# Patient Record
Sex: Female | Born: 1995 | Race: White | Hispanic: No | Marital: Single | Smoking: Former smoker
Health system: Southern US, Community
[De-identification: ages and names within clinical notes are randomized; demographics above are authoritative.]

## PROBLEM LIST (undated history)

## (undated) DIAGNOSIS — M419 Scoliosis, unspecified: Secondary | ICD-10-CM

## (undated) DIAGNOSIS — N200 Calculus of kidney: Secondary | ICD-10-CM

## (undated) DIAGNOSIS — F32A Depression, unspecified: Secondary | ICD-10-CM

## (undated) DIAGNOSIS — N390 Urinary tract infection, site not specified: Secondary | ICD-10-CM

## (undated) DIAGNOSIS — E282 Polycystic ovarian syndrome: Secondary | ICD-10-CM

## (undated) DIAGNOSIS — Z87442 Personal history of urinary calculi: Secondary | ICD-10-CM

## (undated) DIAGNOSIS — F319 Bipolar disorder, unspecified: Secondary | ICD-10-CM

## (undated) HISTORY — PX: GASTRIC BYPASS: SHX52

## (undated) HISTORY — PX: KIDNEY STONE SURGERY: SHX686

## (undated) HISTORY — PX: BREAST ENHANCEMENT SURGERY: SHX7

## (undated) HISTORY — PX: TONSILLECTOMY: SUR1361

---
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
## 2013-04-07 ENCOUNTER — Ambulatory Visit: Payer: BC Managed Care – PPO | Admitting: Dietician

## 2014-08-29 DIAGNOSIS — F909 Attention-deficit hyperactivity disorder, unspecified type: Secondary | ICD-10-CM | POA: Insufficient documentation

## 2014-09-05 ENCOUNTER — Other Ambulatory Visit: Payer: Self-pay | Admitting: Gastroenterology

## 2014-09-05 DIAGNOSIS — R112 Nausea with vomiting, unspecified: Secondary | ICD-10-CM

## 2014-09-26 ENCOUNTER — Ambulatory Visit (HOSPITAL_COMMUNITY): Payer: Self-pay

## 2016-03-25 ENCOUNTER — Emergency Department (HOSPITAL_COMMUNITY)
Admission: EM | Admit: 2016-03-25 | Discharge: 2016-03-25 | Disposition: A | Discharge status: Home / Self Care | Payer: BLUE CROSS/BLUE SHIELD | Attending: Emergency Medicine | Admitting: Emergency Medicine

## 2016-03-25 ENCOUNTER — Emergency Department (HOSPITAL_COMMUNITY): Payer: BLUE CROSS/BLUE SHIELD

## 2016-03-25 ENCOUNTER — Encounter (HOSPITAL_COMMUNITY): Payer: Self-pay | Admitting: Emergency Medicine

## 2016-03-25 DIAGNOSIS — N201 Calculus of ureter: Secondary | ICD-10-CM | POA: Insufficient documentation

## 2016-03-25 DIAGNOSIS — R1032 Left lower quadrant pain: Secondary | ICD-10-CM | POA: Diagnosis present

## 2016-03-25 HISTORY — DX: Calculus of kidney: N20.0

## 2016-03-25 LAB — URINALYSIS, ROUTINE W REFLEX MICROSCOPIC
GLUCOSE, UA: NEGATIVE mg/dL
KETONES UR: 40 mg/dL — AB
NITRITE: NEGATIVE
PH: 6.5 (ref 5.0–8.0)
Protein, ur: NEGATIVE mg/dL
SPECIFIC GRAVITY, URINE: 1.014 (ref 1.005–1.030)

## 2016-03-25 LAB — I-STAT CHEM 8, ED
BUN: 9 mg/dL (ref 6–20)
CHLORIDE: 105 mmol/L (ref 101–111)
CREATININE: 1 mg/dL (ref 0.44–1.00)
Calcium, Ion: 1.21 mmol/L (ref 1.15–1.40)
GLUCOSE: 96 mg/dL (ref 65–99)
HEMATOCRIT: 48 % — AB (ref 36.0–46.0)
HEMOGLOBIN: 16.3 g/dL — AB (ref 12.0–15.0)
POTASSIUM: 3.4 mmol/L — AB (ref 3.5–5.1)
Sodium: 142 mmol/L (ref 135–145)
TCO2: 22 mmol/L (ref 0–100)

## 2016-03-25 LAB — URINE MICROSCOPIC-ADD ON

## 2016-03-25 LAB — I-STAT BETA HCG BLOOD, ED (MC, WL, AP ONLY): I-stat hCG, quantitative: <5 m[IU]/mL (ref <5)

## 2016-03-25 MED ORDER — TAMSULOSIN HCL 0.4 MG PO CAPS: 0.4000 mg | ORAL_CAPSULE | Freq: Every day | ORAL | 0 refills | Status: DC

## 2016-03-25 MED ORDER — ONDANSETRON 8 MG PO TBDP: 8.0000 mg | ORAL_TABLET | Freq: Three times a day (TID) | ORAL | 0 refills | Status: DC | PRN

## 2016-03-25 MED ORDER — MORPHINE SULFATE (PF) 4 MG/ML IV SOLN
4.0000 mg | Freq: Once | INTRAVENOUS | Status: AC
  Administered 2016-03-25: 4 mg via INTRAVENOUS
  Filled 2016-03-25: qty 1

## 2016-03-25 MED ORDER — ONDANSETRON HCL 4 MG/2ML IJ SOLN
4.0000 mg | Freq: Four times a day (QID) | INTRAMUSCULAR | Status: DC | PRN
Start: 2016-03-25 — End: 2016-03-25
  Administered 2016-03-25: 4 mg via INTRAVENOUS
  Filled 2016-03-25: qty 2

## 2016-03-25 MED ORDER — IBUPROFEN 400 MG PO TABS: 400.0000 mg | ORAL_TABLET | Freq: Three times a day (TID) | ORAL | 0 refills | Status: DC | PRN

## 2016-03-25 MED ORDER — OXYCODONE-ACETAMINOPHEN 5-325 MG PO TABS: 1.0000 | ORAL_TABLET | ORAL | 0 refills | Status: DC | PRN

## 2016-03-25 MED ORDER — MORPHINE SULFATE (PF) 4 MG/ML IV SOLN
4.0000 mg | INTRAVENOUS | Status: DC | PRN
Start: 2016-03-25 — End: 2016-03-25
  Administered 2016-03-25 (×2): 4 mg via INTRAVENOUS
  Filled 2016-03-25 (×2): qty 1

## 2016-03-25 MED ORDER — KETOROLAC TROMETHAMINE 30 MG/ML IJ SOLN
30.0000 mg | Freq: Once | INTRAMUSCULAR | Status: AC
  Administered 2016-03-25: 30 mg via INTRAVENOUS
  Filled 2016-03-25: qty 1

## 2016-03-25 NOTE — ED Triage Notes (Signed)
Pt sts left sided flank pain similar to kidney stones in past; pt appears in severe pain at present

## 2016-03-25 NOTE — ED Notes (Signed)
Collected patient urine pt is resting

## 2016-03-26 NOTE — ED Provider Notes (Signed)
[facility]-EMERGENCY DEPT Provider Note   CSN: [id number] Arrival date & time: 03/25/16  9528     History   Chief Complaint Chief Complaint  Patient presents with  . Flank Pain    HPI Colleen Morrison is a 20 y.o. female.  Patient presents with acute left sided flank and left lower abdominal pain which worsened today.  She denies dysuria urinary frequency.  She reports nausea without vomiting.  Denies diarrhea.  No fevers or chills.  She states severe pain in her left low pelvic region at this time.  No vaginal complaints.  No urinary complaints.  Pain is severe in severity.  The patient is writhing in the bed currently.   The history is provided by the patient.    Past Medical History:  Diagnosis Date  . Kidney stones   . Scoliosis   . Urinary tract infection     There are no active problems to display for this patient.   Past Surgical History:  Procedure Laterality Date  . GASTRIC BYPASS    . TONSILLECTOMY      OB History    No data available       Home Medications    Prior to Admission medications   Medication Sig Start Date End Date Taking? Authorizing Provider  cholecalciferol (VITAMIN D) 1000 units tablet Take 1,000 Units by mouth daily.   Yes Historical Provider, MD  Multiple Vitamin (MULTIVITAMIN WITH MINERALS) TABS tablet Take 1 tablet by mouth daily.   Yes Historical Provider, MD  ibuprofen (ADVIL,MOTRIN) 400 MG tablet Take 1 tablet (400 mg total) by mouth every 8 (eight) hours as needed. 03/25/16   Azalia Bilis, MD  ondansetron (ZOFRAN ODT) 8 MG disintegrating tablet Take 1 tablet (8 mg total) by mouth every 8 (eight) hours as needed for nausea or vomiting. 03/25/16   Azalia Bilis, MD  oxyCODONE-acetaminophen (PERCOCET/ROXICET) 5-325 MG tablet Take 1 tablet by mouth every 4 (four) hours as needed for severe pain. 03/25/16   Azalia Bilis, MD  tamsulosin (FLOMAX) 0.4 MG CAPS capsule Take 1 capsule (0.4 mg total) by mouth daily. 03/25/16   Azalia Bilis, MD     Family History Family History  Problem Relation Age of Onset  . Hyperlipidemia Mother   . Diabetes Other   . Hyperlipidemia Other   . Stroke Other   . Heart failure Other     Social History Social History  Substance Use Topics  . Smoking status: Never Smoker  . Smokeless tobacco: Never Used  . Alcohol use Yes     Comment: occ     Allergies   Other   Review of Systems Review of Systems  All other systems reviewed and are negative.    Physical Exam Updated Vital Signs BP (!) 92/53 (BP Location: Right Arm)   Pulse 60   Temp 98.1 F (36.7 C) (Oral)   Resp 20   Ht 5\' 2"  (1.575 m)   Wt 115 lb (52.2 kg)   SpO2 99%   BMI 21.03 kg/m   Physical Exam  Constitutional: She is oriented to person, place, and time. She appears well-developed and well-nourished.  Uncomfortable appearing  HENT:  Head: Normocephalic and atraumatic.  Eyes: EOM are normal.  Neck: Normal range of motion.  Cardiovascular: Normal rate, regular rhythm and normal heart sounds.   Pulmonary/Chest: Effort normal and breath sounds normal.  Abdominal: Soft. She exhibits no distension. There is no tenderness.  Musculoskeletal: Normal range of motion.  Neurological: She is alert  and oriented to person, place, and time.  Skin: Skin is warm and dry.  Psychiatric: She has a normal mood and affect. Judgment normal.  Nursing note and vitals reviewed.    ED Treatments / Results  Labs (all labs ordered are listed, but only abnormal results are displayed) Labs Reviewed  URINALYSIS, ROUTINE W REFLEX MICROSCOPIC (NOT AT [facility]) - Abnormal; Notable for the following:       Result Value   Color, Urine AMBER (*)    APPearance CLOUDY (*)    Hgb urine dipstick LARGE (*)    Bilirubin Urine SMALL (*)    Ketones, ur 40 (*)    Leukocytes, UA TRACE (*)    All other components within normal limits  URINE MICROSCOPIC-ADD ON - Abnormal; Notable for the following:    Squamous Epithelial / LPF 0-5 (*)     Bacteria, UA FEW (*)    All other components within normal limits  I-STAT CHEM 8, ED - Abnormal; Notable for the following:    Potassium 3.4 (*)    Hemoglobin 16.3 (*)    HCT 48.0 (*)    All other components within normal limits  I-STAT BETA HCG BLOOD, ED ([facility], [facility], [facility] ONLY)    EKG  EKG Interpretation None       Radiology Ct Renal Stone Study  Result Date: 03/25/2016 CLINICAL DATA:  Left flank pain.  Prior gastric bypass. EXAM: CT ABDOMEN AND PELVIS WITHOUT CONTRAST TECHNIQUE: Multidetector CT imaging of the abdomen and pelvis was performed following the standard protocol without IV contrast. COMPARISON:  10/21/2012 FINDINGS: Lower chest: Lung bases are clear. No effusions. Heart is normal size. Hepatobiliary: No focal hepatic abnormality. Gallbladder unremarkable. Pancreas: No focal abnormality or ductal dilatation. Spleen: No focal abnormality.  Normal size. Adrenals/Urinary Tract: There is mild fullness of the left renal collecting system. Punctate calcification noted along the left posterior bladder wall concerning for a left UVJ stone. 2 mm nonobstructing stone in the lower pole of the left kidney. No stones or hydronephrosis on the right. Adrenal glands unremarkable. Stomach/Bowel: Appendix is normal. Changes of prior gastric bypass. Bowel grossly unremarkable. Vascular/Lymphatic: No evidence of aneurysm or adenopathy. Reproductive: Uterus and adnexa unremarkable.  No mass. Other: Small amount of free fluid in the pelvis, likely physiologic. No free air. Musculoskeletal: No acute bony abnormality or focal bone lesion. IMPRESSION: Suspect punctate 1 mm stone at the left UVJ. Mild fullness of the left renal collecting system. Left lower pole nephrolithiasis. Prior gastric bypass without visible complicating feature. Electronically Signed   By: Charlett NoseKevin  Dover M.D.   On: 03/25/2016 11:17    Procedures Procedures (including critical care time)  Medications Ordered in ED Medications   morphine 4 MG/ML injection 4 mg (4 mg Intravenous Given 03/25/16 1001)  ketorolac (TORADOL) 30 MG/ML injection 30 mg (30 mg Intravenous Given 03/25/16 1027)     Initial Impression / Assessment and Plan / ED Course  I have reviewed the triage vital signs and the nursing notes.  Pertinent labs & imaging results that were available during my care of the patient were reviewed by me and considered in my medical decision making (see chart for details).  Clinical Course     Patient's pain controlled in the emergency department.  1 mm distal left ureteral stone.  No signs urinary tract infection.  Home with standard stone precautions.  Urology follow-up.  Home with standard medications.  Final Clinical Impressions(s) / ED Diagnoses   Final diagnoses:  Left ureteral  stone    New Prescriptions Discharge Medication List as of 03/25/2016  2:20 PM    START taking these medications   Details  ibuprofen (ADVIL,MOTRIN) 400 MG tablet Take 1 tablet (400 mg total) by mouth every 8 (eight) hours as needed., Starting Mon 03/25/2016, Print    ondansetron (ZOFRAN ODT) 8 MG disintegrating tablet Take 1 tablet (8 mg total) by mouth every 8 (eight) hours as needed for nausea or vomiting., Starting Mon 03/25/2016, Print    oxyCODONE-acetaminophen (PERCOCET/ROXICET) 5-325 MG tablet Take 1 tablet by mouth every 4 (four) hours as needed for severe pain., Starting Mon 03/25/2016, Print    tamsulosin (FLOMAX) 0.4 MG CAPS capsule Take 1 capsule (0.4 mg total) by mouth daily., Starting Mon 03/25/2016, Print         Azalia BilisKevin Mckynleigh Mussell, MD 03/26/16 [phone]

## 2016-04-11 ENCOUNTER — Emergency Department (HOSPITAL_COMMUNITY)
Admission: EM | Admit: 2016-04-11 | Discharge: 2016-04-12 | Disposition: A | Discharge status: Home / Self Care | Payer: PRIVATE HEALTH INSURANCE | Attending: Emergency Medicine | Admitting: Emergency Medicine

## 2016-04-11 ENCOUNTER — Encounter (HOSPITAL_COMMUNITY): Payer: Self-pay | Admitting: *Deleted

## 2016-04-11 DIAGNOSIS — F332 Major depressive disorder, recurrent severe without psychotic features: Secondary | ICD-10-CM | POA: Diagnosis not present

## 2016-04-11 DIAGNOSIS — Z5181 Encounter for therapeutic drug level monitoring: Secondary | ICD-10-CM | POA: Insufficient documentation

## 2016-04-11 DIAGNOSIS — F17210 Nicotine dependence, cigarettes, uncomplicated: Secondary | ICD-10-CM | POA: Insufficient documentation

## 2016-04-11 DIAGNOSIS — Z823 Family history of stroke: Secondary | ICD-10-CM | POA: Diagnosis not present

## 2016-04-11 DIAGNOSIS — F329 Major depressive disorder, single episode, unspecified: Secondary | ICD-10-CM | POA: Insufficient documentation

## 2016-04-11 DIAGNOSIS — Z833 Family history of diabetes mellitus: Secondary | ICD-10-CM | POA: Diagnosis not present

## 2016-04-11 DIAGNOSIS — Z8349 Family history of other endocrine, nutritional and metabolic diseases: Secondary | ICD-10-CM | POA: Diagnosis not present

## 2016-04-11 DIAGNOSIS — F32A Depression, unspecified: Secondary | ICD-10-CM

## 2016-04-11 DIAGNOSIS — R45851 Suicidal ideations: Secondary | ICD-10-CM | POA: Diagnosis present

## 2016-04-11 HISTORY — DX: Polycystic ovarian syndrome: E28.2

## 2016-04-11 LAB — ACETAMINOPHEN LEVEL

## 2016-04-11 LAB — SALICYLATE LEVEL

## 2016-04-11 LAB — COMPREHENSIVE METABOLIC PANEL
ALK PHOS: 65 U/L (ref 38–126)
ALT: 16 U/L (ref 14–54)
ANION GAP: 9 (ref 5–15)
AST: 19 U/L (ref 15–41)
Albumin: 5 g/dL (ref 3.5–5.0)
BILIRUBIN TOTAL: 1 mg/dL (ref 0.3–1.2)
BUN: 15 mg/dL (ref 6–20)
CALCIUM: 10.1 mg/dL (ref 8.9–10.3)
CO2: 25 mmol/L (ref 22–32)
Chloride: 105 mmol/L (ref 101–111)
Creatinine, Ser: 0.85 mg/dL (ref 0.44–1.00)
Glucose, Bld: 84 mg/dL (ref 65–99)
POTASSIUM: 3.9 mmol/L (ref 3.5–5.1)
Sodium: 139 mmol/L (ref 135–145)
TOTAL PROTEIN: 8.1 g/dL (ref 6.5–8.1)

## 2016-04-11 LAB — RAPID URINE DRUG SCREEN, HOSP PERFORMED
Amphetamines: NOT DETECTED
BARBITURATES: NOT DETECTED
Benzodiazepines: NOT DETECTED
COCAINE: NOT DETECTED
OPIATES: NOT DETECTED
Tetrahydrocannabinol: POSITIVE — AB

## 2016-04-11 LAB — CBC
HEMATOCRIT: 43.5 % (ref 36.0–46.0)
Hemoglobin: 14.9 g/dL (ref 12.0–15.0)
MCH: 30 pg (ref 26.0–34.0)
MCHC: 34.3 g/dL (ref 30.0–36.0)
MCV: 87.7 fL (ref 78.0–100.0)
PLATELETS: 364 10*3/uL (ref 150–400)
RBC: 4.96 MIL/uL (ref 3.87–5.11)
RDW: 12.6 % (ref 11.5–15.5)
WBC: 10.1 10*3/uL (ref 4.0–10.5)

## 2016-04-11 LAB — POC URINE PREG, ED: Preg Test, Ur: NEGATIVE

## 2016-04-11 LAB — ETHANOL

## 2016-04-11 NOTE — ED Notes (Signed)
Pt on phone with mother requesting a ride home.  Pt requested RN speak with her mother.  Mother stating "we're not bringing her home in our car.  She's had suicidal thoughts all day and we don't feel comfortable letting her ride in our car.  She was told police would take her home."  Informed mother police do not transport pt's home, bus passes are available but the last bus stops @ 2300 and no taxi vouchers are provided.  Call transferred to Samul DadaLisa, Charge RN.

## 2016-04-11 NOTE — ED Notes (Signed)
Pt currently does not want to remove clothing & jewelry stating "what if I don't stay."

## 2016-04-11 NOTE — ED Notes (Signed)
Pt stated "I have the oxycodone for kidney stones.  My parents don't like me the way I am.  They don't like my hair color or my piercings.  They tell me if I loved them I would change but I'm happy with myself.  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

## 2016-04-11 NOTE — BH Assessment (Addendum)
Tele Assessment Note   Colleen Morrison is an 20 y.o. female, who [facility]. Pt reported she was on the phone with her brother, and she said, she didn't want to be around any more. Pt reported, her brother called their mother, because he thought she took pills. Pt's brother called the police, who called EMS, who [facility] the [facility]. Pt reported she did not take any pills. Pt reported passive SI earlier, but nothing current. Pt denies SI, HI, AVH and self-injurious behaviors. Pt reported, having a default suicide plan, taking her medication for her kidney stones (Oxycodone). Pt reported, her triggers are her parents because she feels they do not accept her for who she is. Pt reported her best is not good enough. Pt reported she recently moved from [place] and her transition has been "great." Clinician noted from a nurse pt's mother has concerns if the pt is discharged. Pt's mother reported, the pt text her at 2028, she has ruined her life and if she is IVC'd she felt like killing herself. Pt's mother reported, the pt has been sending her suicidal text messages. Pt's mother reported, the pt text, say your goodbyes and be done.  Per pt's mother, her father and brother, also report, not feeling safe if the pt is [facility].   Per Dr.  Edwin DadaLong's note: "Colleen Morrison is a 20 y.o. female with PMH of depression not on medication with no prior suicide attempts presents to the emergency department for evaluation of suicidal thoughts and worsening depression. He states that she is recently moved back to [place] from [place] and had a fight this evening with her parents. She notes that over the past several weeks she's had worsening depression and intermittent suicidal thoughts. This evening after the fight she had thoughts of not wanting to be alive anymore but denies making an active plan. She was on the phone with another individual to ended up calling police  because they were concerned about her verbalized suicidal thoughts. Patient states that her thoughts of not changed significantly and she is not making any plans to do so. She is looking forward to starting a new job and [place] to get her own place. She feels that these are positive steps in her life and feels motivated to complete them. She denies any alcohol or drug abuse. No other medical complaints at this time."  Pt denied experiencing verbal, physical and sexual abuse. Pt's UDS is positive for marijuana. Pt reported not having a psychiatrist or a Veterinary surgeoncounselor. Pt denied previous inpatient admissions.   Pt presented alert, in street clothes, with logical/coherent speech. Pt's mood and affect are sad. Pt's thought process is coherent/relevant. Pt's judgement is partial. Pt's insight and impulse control are fair. Pt is oriented x4 (date, year, city and state). Pt reported if discharged she could contract for safety. Pt reported, if inpatient is recommended she will not sign in voluntarily. Clinican discussed the three possible dispositions (discharge with resources, AM Psychiatric Evaluation and inpatient treatment). Clinican discussed IVC and how it can impact the pt.   Diagnosis: Major Depressive Disorder, Recurrent, Severe  Past Medical History:  Past Medical History:  Diagnosis Date  . Kidney stones   . PCOS (polycystic ovarian syndrome)   . Scoliosis   . Urinary tract infection     Past Surgical History:  Procedure Laterality Date  . GASTRIC BYPASS    . TONSILLECTOMY  Family History:  Family History  Problem Relation Age of Onset  . Hyperlipidemia Mother   . Diabetes Other   . Hyperlipidemia Other   . Stroke Other   . Heart failure Other     Social History:  reports that she has been smoking Cigarettes.  She has never used smokeless tobacco. She reports that she drinks alcohol. She reports that she uses drugs, including  Marijuana.  Additional Social History:  Alcohol / Drug Use Pain Medications: See MAR Prescriptions: See MAR Over the Counter: See MAR History of alcohol / drug use?: Yes Substance #1 Name of Substance 1: Marijuana  1 - Age of First Use: UTA 1 - Amount (size/oz): UTA 1 - Frequency: UTA 1 - Duration: UTA 1 - Last Use / Amount: UTA  CIWA: CIWA-Ar BP: 119/69 Pulse Rate: (!) 54 COWS:    PATIENT STRENGTHS: (choose at least two) Communication skills Supportive family/friends  Allergies:  Allergies  Allergen Reactions  . Other     mqysma -swelling    Home Medications:  (Not in a hospital admission)  OB/GYN Status:  Patient's last menstrual period was 03/14/2016 (approximate).  General Assessment Data [facility] of Assessment: [facility] TTS Assessment: In system Is this a Tele or Face-to-Face Assessment?: Face-to-Face Is this an Initial Assessment or a Re-assessment for this encounter?: Initial Assessment Marital status: Single Maiden name: NA Is patient pregnant?: No Pregnancy Status: No Living Arrangements: Other (Comment) (Pt reported with her best friend. ) Can pt return to current living arrangement?: Yes Admission Status: Voluntary Is patient capable of signing voluntary admission?: Yes Referral Source: Self/Family/Friend Insurance type: BCBS     Crisis Care Plan Living Arrangements: Other (Comment) (Pt reported with her best friend. ) Legal Guardian:  (Self) Name of Psychiatrist: NA Name of Therapist: NA  Education Status Is patient currently in school?: No Current Grade: NA Highest grade of school patient has completed: Pt reported four and a half years in college.  Name of school: NA Contact person: NA  Risk to self with the past 6 months Suicidal Ideation: No (Pt reported earlier passive SI.) Has patient been a risk to self within the past 6 months prior to admission? : No Suicidal Intent: No Has patient had any suicidal intent within the past 6  months prior to admission? : No Is patient at risk for suicide?: No Suicidal Plan?: No (Pt reported, default to take pills, but will not do it. ) Has patient had any suicidal plan within the past 6 months prior to admission? :  (Unknown) Access to Means:  (Pt has pills.) What has been your use of drugs/alcohol within the last 12 months?: marijuana Previous Attempts/Gestures: No How many times?: 0 Other Self Harm Risks: NA Triggers for Past Attempts: None known Intentional Self Injurious Behavior: None Family Suicide History: Unknown Recent stressful life event(s): Other (Comment) (Pt reported her family not accepting her for who she is. ) Persecutory voices/beliefs?: No Depression: Yes Depression Symptoms:  (sadness) Substance abuse history and/or treatment for substance abuse?: No Suicide prevention information given to non-admitted patients: Not applicable  Risk to Others within the past 6 months Homicidal Ideation: No Does patient have any lifetime risk of violence toward others beyond the six months prior to admission? : No Thoughts of Harm to Others: No Current Homicidal Intent: No Current Homicidal Plan: No Access to Homicidal Means: No Identified Victim: NA History of harm to others?: No Assessment of Violence: None Noted Violent Behavior Description: NA Does patient  have access to weapons?: No Criminal Charges Pending?: No Describe Pending Criminal Charges: NA Does patient have a court date: Yes Court Date: 06/11/16 Is patient on probation?: Yes (For possession of marijuana)  Psychosis Hallucinations: None noted Delusions: None noted  Mental Status Report Appearance/Hygiene: Unremarkable Eye Contact: Fair Motor Activity: Unremarkable Speech: Logical/coherent Level of Consciousness: Alert Mood: Sad Affect: Appropriate to circumstance Anxiety Level: Minimal Thought Processes: Coherent, Relevant Judgement: Unimpaired Orientation: Other (Comment) (date, year,  city and state.) Obsessive Compulsive Thoughts/Behaviors: Unable to Assess  Cognitive Functioning Concentration: Fair Memory: Recent Intact IQ: Average Insight: Good Impulse Control: Good Appetite: Fair Weight Loss:  (Pt had gastric bypass December 2016.) Weight Gain: 0 Sleep: Decreased Total Hours of Sleep: 6 Vegetative Symptoms: None  ADLScreening [facility] Assessment Services) Patient's cognitive ability adequate to safely complete daily activities?: Yes Patient able to express need for assistance with ADLs?: Yes Independently performs ADLs?: Yes (appropriate for developmental age)  Prior Inpatient Therapy Prior Inpatient Therapy: No Prior Therapy Dates: NA Prior Therapy Facilty/Provider(s): NA Reason for Treatment: NA  Prior Outpatient Therapy Prior Outpatient Therapy: No (In [place], nothing current. ) Prior Therapy Dates: NA Prior Therapy Facilty/Provider(s): NA Reason for Treatment: NA Does patient have an ACCT team?: No Does patient have Intensive In-House Services?  : No Does patient have [facility] services? : No Does patient have P4CC services?: No  ADL Screening (condition at time of admission) Patient's cognitive ability adequate to safely complete daily activities?: Yes Is the patient deaf or have difficulty hearing?: No Does the patient have difficulty seeing, even when wearing glasses/contacts?: Yes (Pt reports wearing glasses. ) Does the patient have difficulty concentrating, remembering, or making decisions?: Yes (Pt reported difficulty concentrating. ) Patient able to express need for assistance with ADLs?: Yes Does the patient have difficulty dressing or bathing?: No Independently performs ADLs?: Yes (appropriate for developmental age) Does the patient have difficulty walking or climbing stairs?: No Weakness of Legs: None Weakness of Arms/Hands: None       Abuse/Neglect Assessment (Assessment to be complete while patient is alone) Physical Abuse:  Denies (Pt denies. ) Verbal Abuse: Denies (Pt denies.) Sexual Abuse: Denies (Pt denies. )     Advance Directives (For Healthcare) Does Patient Have a Medical Advance Directive?: No    Additional Information 1:1 In Past 12 Months?: No CIRT Risk: No Elopement Risk: No Does patient have medical clearance?: Yes     Disposition: Dr. Jacqulyn BathLong recommends AM Psychiatric Evaluation. Disposition discussed with Kallie LocksAjsa, RN, Consuella LoseElaine, RN and pt's mother.  Disposition Initial Assessment Completed for this Encounter: Yes Disposition of Patient: Other dispositions (Discharged with OPT resoruces, sign No-Harm contract. ) Other disposition(s): Other (Comment) (discharged with OPT resoruces, sign "no-harm" contract)  Gwinda Passereylese D Bennett 04/11/2016 11:48 PM   Gwinda Passereylese D Bennett, MS, [facility], [facility] Triage Specialist [phone]

## 2016-04-11 NOTE — ED Notes (Signed)
Spoke with TTS, approximately 30 minutes before consult.   Pt informed.  Pt now requesting to leave.  Informed pt will speak to Dr. Jacqulyn BathLong.

## 2016-04-11 NOTE — ED Notes (Signed)
Pt stating "how much longer before the consult occurs?"  Informed will speak with TTS.

## 2016-04-11 NOTE — ED Provider Notes (Signed)
Emergency Department Provider Note   I have reviewed the triage vital signs and the nursing notes.   HISTORY  Chief Complaint Suicidal   HPI Colleen FudgeStephanie A Morrison is a 20 y.o. female with PMH of depression not on medication with no prior suicide attempts presents to the emergency department for evaluation of suicidal thoughts and worsening depression. He states that she is recently moved back to [place] and had a fight this evening with her parents. She notes that over the past several weeks she's had worsening depression and intermittent suicidal thoughts. This evening after the fight she had thoughts of not wanting to be alive anymore but denies making an active plan. She was on the phone with another individual to ended up calling police because they were concerned about her verbalized suicidal thoughts.   Patient states that her thoughts of not changed significantly and she is not making any plans to do so. She is looking forward to starting a new job and [place] to get her own place. She feels that these are positive steps in her life and feels motivated to complete them. She denies any alcohol or drug abuse. No other medical complaints at this time.   Past Medical History:  Diagnosis Date  . Kidney stones   . PCOS (polycystic ovarian syndrome)   . Scoliosis   . Urinary tract infection     There are no active problems to display for this patient.   Past Surgical History:  Procedure Laterality Date  . GASTRIC BYPASS    . TONSILLECTOMY      Current Outpatient Rx  . Order #: [id number] Class: Print  . Order #: [id number] Class: Print  . Order #: [id number] Class: Print  . Order #: [id number] Class: Print    Allergies Other  Family History  Problem Relation Age of Onset  . Hyperlipidemia Mother   . Diabetes Other   . Hyperlipidemia Other   . Stroke Other   . Heart failure Other     Social History Social History    Substance Use Topics  . Smoking status: Current Some Day Smoker    Types: Cigarettes  . Smokeless tobacco: Never Used  . Alcohol use Yes     Comment: occ    Review of Systems  Constitutional: No fever/chills Eyes: No visual changes. ENT: No sore throat. Cardiovascular: Denies chest pain. Respiratory: Denies shortness of breath. Gastrointestinal: No abdominal pain.  No nausea, no vomiting.  No diarrhea.  No constipation. Genitourinary: Negative for dysuria. Musculoskeletal: Negative for back pain. Skin: Negative for rash. Neurological: Negative for headaches, focal weakness or numbness. Psychiatric:Worsening depression and passive SI.   10-point ROS otherwise negative.  ____________________________________________   PHYSICAL EXAM:  VITAL SIGNS: ED Triage Vitals [04/11/16 2009]  Enc Vitals Group     BP 118/82     Pulse Rate (!) 57     Resp 16     Temp 97.5 F (36.4 C)     Temp Source Oral     SpO2 99 %     Weight 115 lb (52.2 kg)     Height 5\' 2"  (1.575 m)   Constitutional: Alert and oriented. Well appearing and in no acute distress. Eyes: Conjunctivae are normal.  Head: Atraumatic. Nose: No congestion/rhinnorhea. Mouth/Throat: Mucous membranes are moist.  Oropharynx non-erythematous. Neck: No stridor.  Cardiovascular: Normal rate, regular rhythm. Good peripheral circulation. Grossly normal heart sounds.   Respiratory: Normal respiratory effort.  No retractions. Lungs CTAB.  Gastrointestinal: Soft and nontender. No distention.  Musculoskeletal: No lower extremity tenderness nor edema. No gross deformities of extremities. Neurologic:  Normal speech and language. No gross focal neurologic deficits are appreciated.  Skin:  Skin is warm, dry and intact. No rash noted. Psychiatric: Mood and affect are depressed. Speech and behavior are normal.  ____________________________________________   LABS (all labs ordered are listed, but only abnormal results are  displayed)  Labs Reviewed  ACETAMINOPHEN LEVEL - Abnormal; Notable for the following:       Result Value   Acetaminophen (Tylenol), Serum <10 (*)    All other components within normal limits  RAPID URINE DRUG SCREEN, HOSP PERFORMED - Abnormal; Notable for the following:    Tetrahydrocannabinol POSITIVE (*)    All other components within normal limits  COMPREHENSIVE METABOLIC PANEL  ETHANOL  SALICYLATE LEVEL  CBC  POC URINE PREG, ED   ____________________________________________   PROCEDURES  Procedure(s) performed:   Procedures   None ____________________________________________   INITIAL IMPRESSION / ASSESSMENT AND PLAN / ED COURSE  Pertinent labs & imaging results that were available during my care of the patient were reviewed by me and considered in my medical decision making (see chart for details).  Patient presents to the emergency department for evaluation of suicidal thoughts and worsening depression. She denies suicidal or homicidal thoughts to me but states that she had some passive thoughts earlier in the evening of "not wanting to be here anymore."  She denies any active plan. She does not feel that she is a danger to herself or others. No IVC paperwork at this time. The patient agrees to stay voluntarily to see TTS.   11:02 PM Patient denies suicidal or homicidal ideation. She [facility] voluntarily but has decided that she can no longer wait. I had a long discussion with her regarding her situation this evening. She is unwilling to stay longer. Plan for discharge with outpatient resources.   [facility] and spoke with mom who read back text messages from this evening that describe specific suicidal threats. The patient is denying these currently but [facility] recommends overnight stay for psychiatry evaluation in the AM.  ____________________________________________  FINAL CLINICAL IMPRESSION(S) / ED DIAGNOSES  Final diagnoses:  Depression,  unspecified depression type     MEDICATIONS GIVEN DURING THIS VISIT:  Medications  acetaminophen (TYLENOL) tablet 1,000 mg (1,000 mg Oral Given 04/12/16 0053)     NEW OUTPATIENT MEDICATIONS STARTED DURING THIS VISIT:  None   Note:  This document was prepared using Dragon voice recognition software and may include unintentional dictation errors.  Alona BeneJoshua Long, MD Emergency Medicine   Maia PlanJoshua G Long, MD 04/12/16 [phone]

## 2016-04-11 NOTE — ED Triage Notes (Signed)
Pt brought in by [facility].  Per EMS, pt was talking to her brother & expressed SI, had an empty bottle of oxycodone & 8-10 pills in her pocket, recently moved back to [place] x 3 wks ago, hx of depression, denies SI but having thoughts of what's the point.  [place] counselor [place] d/t hx of gastric bypass.  Has lost 120#, hx of THC use, last used x 3 wks ago.

## 2016-04-11 NOTE — ED Notes (Signed)
TTS in with pt. 

## 2016-04-11 NOTE — ED Notes (Signed)
Pt now stating "I just can't wait any longer".  Dr. Jacqulyn BathLong informed & is requesting she wait until paperwork is completed.

## 2016-04-11 NOTE — ED Notes (Signed)
Dr. Jacqulyn BathLong informed & in to room.

## 2016-04-11 NOTE — Discharge Instructions (Signed)
Outpatient Psychiatry and Counseling  [facility]: Mobile Crisis Management 24 hours:  [phone]  [facility] sliding scale fee and walk in schedule: M-F 8am-12pm/1pm-3pm [address]  [place], [place] [address] [phone]  [facility] [address] [place], [place] [address] [phone]  [facility] ([facility])- new patient walk-in appointments available Monday - Friday 8am -3pm.          [address] [place], [place] [address] [phone] or crisis line- [phone]  [facility] Outpatient Services/ Intensive Outpatient Therapy Program [address] [place], [place] [address] [phone]  [facility]                  Crisis Services      [phone]      [address]     [place], [place] [address]                 [facility]   [facility] [phone] [address] [place], [place] [address]   [facility]          [address],  [place], [place] [address]       [phone]  [facility] [address] [place], [place] [address] [phone]  [facility]    [address]    [place], [place] [address]     [phone]       Parish McKinney, MD     [address]     [place] [address]     [phone]       [facility] [address] [place] [address]  [facility]     [address]     [place], [place]      [phone]       [facility] Shamsher Ahluwalia, MD [address] [place], [place] [address] [phone]  [facility]     [address]     [place], [place] [address]     [phone]       [facility] [address] [place], [place] [address] [phone] Resources for Temporary  Residential Assistance/Crisis Centers  [facility]) M-F 8am-3pm   [address] [place], [place] [address]   [phone] Services include: laundry, barbering, support groups, case management, phone  & computer access, showers, AA/NA mtgs, mental health/substance abuse nurse, job skills class, disability information, VA assistance, spiritual classes, etc.   HOMELESS SHELTERS  [facility]     [facility]   [address], [place]     [phone]              [facility] (women and children)       [address] [place], [place] [address] [phone] [email or web address] for application and process Application Required  [facility]   [address]    [place] [address]     [phone]                    [facility] [address] [place], [place] [address] [phone](schedule application appt.) Application Required  [facility] (women only)    [address]     [place],  [address]       [phone]      Intake starts 6pm daily Need valid ID, SSC, & Police report [facility] [place] [address] [place], [place] [phone] Application Required  [facility] (men only)     [address]      [place], [place]     [phone]       [facility] (Pregnant women only) [address] [place], [place] [phone]  [facility]      [address]      [place], [place] [address]     [phone]             [place] [facility] [address] [place] [phone] 90 day commitment/SA/Application process  [facility](men only)     [address]     [place], [place]     [phone]       Check-in at 7pm            [facility] [address] [place], [place] [address] [phone] Men/Women/Women and Children  must be there by 7 pm  [facility] [place], [place] [phone]                 

## 2016-04-12 ENCOUNTER — Inpatient Hospital Stay (HOSPITAL_COMMUNITY)
Admission: AD | Admit: 2016-04-12 | Discharge: 2016-04-14 | DRG: 885 | Disposition: A | Discharge status: Home / Self Care | Payer: BLUE CROSS/BLUE SHIELD | Source: Intra-hospital | Attending: Psychiatry | Admitting: Psychiatry

## 2016-04-12 ENCOUNTER — Encounter (HOSPITAL_COMMUNITY): Payer: Self-pay

## 2016-04-12 DIAGNOSIS — R45851 Suicidal ideations: Secondary | ICD-10-CM | POA: Diagnosis present

## 2016-04-12 DIAGNOSIS — F1099 Alcohol use, unspecified with unspecified alcohol-induced disorder: Secondary | ICD-10-CM | POA: Diagnosis not present

## 2016-04-12 DIAGNOSIS — Z87442 Personal history of urinary calculi: Secondary | ICD-10-CM

## 2016-04-12 DIAGNOSIS — Z62820 Parent-biological child conflict: Secondary | ICD-10-CM | POA: Diagnosis not present

## 2016-04-12 DIAGNOSIS — F332 Major depressive disorder, recurrent severe without psychotic features: Secondary | ICD-10-CM | POA: Diagnosis present

## 2016-04-12 DIAGNOSIS — Z79899 Other long term (current) drug therapy: Secondary | ICD-10-CM

## 2016-04-12 DIAGNOSIS — F411 Generalized anxiety disorder: Secondary | ICD-10-CM | POA: Diagnosis present

## 2016-04-12 DIAGNOSIS — Z87891 Personal history of nicotine dependence: Secondary | ICD-10-CM

## 2016-04-12 DIAGNOSIS — F17210 Nicotine dependence, cigarettes, uncomplicated: Secondary | ICD-10-CM

## 2016-04-12 DIAGNOSIS — Z79891 Long term (current) use of opiate analgesic: Secondary | ICD-10-CM | POA: Diagnosis not present

## 2016-04-12 DIAGNOSIS — G4700 Insomnia, unspecified: Secondary | ICD-10-CM | POA: Diagnosis present

## 2016-04-12 DIAGNOSIS — Z823 Family history of stroke: Secondary | ICD-10-CM

## 2016-04-12 DIAGNOSIS — Z8249 Family history of ischemic heart disease and other diseases of the circulatory system: Secondary | ICD-10-CM | POA: Diagnosis not present

## 2016-04-12 DIAGNOSIS — M419 Scoliosis, unspecified: Secondary | ICD-10-CM | POA: Diagnosis present

## 2016-04-12 DIAGNOSIS — Z8489 Family history of other specified conditions: Secondary | ICD-10-CM | POA: Diagnosis not present

## 2016-04-12 DIAGNOSIS — Z833 Family history of diabetes mellitus: Secondary | ICD-10-CM

## 2016-04-12 DIAGNOSIS — Z9889 Other specified postprocedural states: Secondary | ICD-10-CM | POA: Diagnosis not present

## 2016-04-12 DIAGNOSIS — Z9884 Bariatric surgery status: Secondary | ICD-10-CM

## 2016-04-12 DIAGNOSIS — Z8349 Family history of other endocrine, nutritional and metabolic diseases: Secondary | ICD-10-CM

## 2016-04-12 MED ORDER — ACETAMINOPHEN 325 MG PO TABS
650.0000 mg | ORAL_TABLET | Freq: Four times a day (QID) | ORAL | Status: DC | PRN
  Administered 2016-04-12 – 2016-04-13 (×2): 650 mg via ORAL
  Filled 2016-04-12 (×2): qty 2

## 2016-04-12 MED ORDER — ACETAMINOPHEN 500 MG PO TABS
1000.0000 mg | ORAL_TABLET | Freq: Once | ORAL | Status: AC
  Administered 2016-04-12: 1000 mg via ORAL
  Filled 2016-04-12: qty 2

## 2016-04-12 MED ORDER — MIRTAZAPINE 7.5 MG PO TABS
7.5000 mg | ORAL_TABLET | Freq: Every day | ORAL | Status: DC
  Administered 2016-04-12 – 2016-04-13 (×2): 7.5 mg via ORAL
  Filled 2016-04-12 (×4): qty 1

## 2016-04-12 MED ORDER — MAGNESIUM HYDROXIDE 400 MG/5ML PO SUSP: 30.0000 mL | Freq: Every day | ORAL | Status: DC | PRN

## 2016-04-12 MED ORDER — MIRTAZAPINE 7.5 MG PO TABS
7.5000 mg | ORAL_TABLET | Freq: Every day | ORAL | Status: DC
  Filled 2016-04-12: qty 1

## 2016-04-12 MED ORDER — HYDROXYZINE HCL 25 MG PO TABS: 25.0000 mg | ORAL_TABLET | Freq: Four times a day (QID) | ORAL | Status: DC | PRN

## 2016-04-12 MED ORDER — ALUM & MAG HYDROXIDE-SIMETH 200-200-20 MG/5ML PO SUSP: 30.0000 mL | ORAL | Status: DC | PRN

## 2016-04-12 MED ORDER — HYDROXYZINE HCL 25 MG PO TABS
25.0000 mg | ORAL_TABLET | Freq: Four times a day (QID) | ORAL | Status: DC | PRN
  Administered 2016-04-12: 25 mg via ORAL
  Filled 2016-04-12: qty 10
  Filled 2016-04-12: qty 1

## 2016-04-12 NOTE — Progress Notes (Signed)
04/12/16 1337:  LRT went to pt room, pt was with visitor.  Caroll RancherMarjette Jonae Renshaw, LRT/CTRS

## 2016-04-12 NOTE — ED Notes (Signed)
Pt oriented to room and unit.  Pt is calm and cooperative.  She denies S/I,H/I, and AVH.  15 minute checks and video monitoring in place. 

## 2016-04-12 NOTE — Consult Note (Signed)
[facility] Face-to-Face Psychiatry Consult   Reason for Consult:  Suicidal Ideation with suicidal text messages Referring Physician:  EDP Patient Identification: Colleen Morrison MRN:  [id number] Principal Diagnosis: MDD (major depressive disorder), recurrent severe, without psychosis (HCC) Diagnosis:   Patient Active Problem List   Diagnosis Date Noted  . MDD (major depressive disorder), recurrent severe, without psychosis (HCC) [F33.2] 04/12/2016    Priority: High    Total Time spent with patient: 25 minutes  Subjective:   Colleen Morrison is a 20 y.o. female patient admitted with reports of suicidal text messages sent to family members. Pt seen and chart reviewed. Pt is alert/oriented x4, calm, cooperative, and appropriate to situation. Pt denies suicidal/homicidal ideation and psychosis and does not appear to be responding to internal stimuli. However, pt was very tearful and crying during the entire interview talking about how depressed she was about conflict regarding her ex who is now engaged and "lied about it" and with her father "who was harsh" about the situation and her mother who "should have told her he was engaged" and other family strain. Pt is labile although not psychotic. However, due to mood lability, concerning collateral, and pt presenting as severely depressed and reactive to these situations and tension with her immediate support system, we have offered the pt the option to sign in voluntarily. Pt accepted this offer and will go inpatient when we find placement.  Marland Kitchen  HPI:  I have reviewed and concur with HPI elements below, modified as follows:  Colleen Morrison is an 20 y.o. female, who presents voluntarily and unaccompanied to [facility]. Pt reported she was on the phone with her brother, and she said, she didn't want to be around any more. Pt reported, her brother called their mother, because he thought she took pills. Pt's brother called the police, who called EMS, who brought the pt  to [facility]. Pt reported she did not take any pills. Pt reported passive SI earlier, but nothing current. Pt denies SI, HI, AVH and self-injurious behaviors. Pt reported, having a default suicide plan, taking her medication for her kidney stones (Oxycodone). Pt reported, her triggers are her parents because she feels they do not accept her for who she is. Pt reported her best is not good enough. Pt reported she recently moved from [place] and her transition has been "great." Clinician noted from a nurse pt's mother has concerns if the pt is discharged. Pt's mother reported, the pt text her at 2028, she has ruined her life and if she is IVC'd she felt like killing herself. Pt's mother reported, the pt has been sending her suicidal text messages. Pt's mother reported, the pt text, say your goodbyes and be done.  Per pt's mother, her father and brother, also report, not feeling safe if the pt is discharged from [facility].   Per Dr.  Edwin Dada note: "Colleen Morrison a 20 y.o.femalewith PMH of depression not on medication with no prior suicide attempts presents to the emergency department for evaluation of suicidal thoughts and worsening depression. He states that she is recently moved back to [place] from [place] had a fight this evening with her parents. She notes that over the past several weeks she's had worsening depression and intermittent suicidal thoughts. This evening after the fight she had thoughts of not wanting to be alive anymore but denies making an active plan. She was on the phone with another individual to ended up calling police because they were concerned about her verbalized  suicidal thoughts. Patient states that her thoughts of not changed significantly and she is not making any plans to do so. She is looking forward to starting a new job and is moving from [employer] to [place] to get her own place. She feels that these are positive steps in her life and feels motivated to complete  them. She denies any alcohol or drug abuse. No other medical complaints at this time."  Pt denied experiencing verbal, physical and sexual abuse. Pt's UDS is positive for marijuana. Pt reported not having a psychiatrist or a Social worker. Pt denied previous inpatient admissions.   Today on 04/12/2016 , pt seen and chart reviewed for psychiatric evaluation as above. Pt has been cooperative with ED staff yet has been minimizing the severity of the situation. See details above.   Past Psychiatric History: depression, anxiety  Risk to Self: Suicidal Ideation: No (Pt reported earlier passive SI.) Suicidal Intent: No Is patient at risk for suicide?: No Suicidal Plan?: No (Pt reported, default to take pills, but will not do it. ) Access to Means:  (Pt has pills.) What has been your use of drugs/alcohol within the last 12 months?: marijuana How many times?: 0 Other Self Harm Risks: NA Triggers for Past Attempts: None known Intentional Self Injurious Behavior: None Risk to Others: Homicidal Ideation: No Thoughts of Harm to Others: No Current Homicidal Intent: No Current Homicidal Plan: No Access to Homicidal Means: No Identified Victim: NA History of harm to others?: No Assessment of Violence: None Noted Violent Behavior Description: NA Does patient have access to weapons?: No Criminal Charges Pending?: No Describe Pending Criminal Charges: NA Does patient have a court date: Yes Court Date: 06/11/16 Prior Inpatient Therapy: Prior Inpatient Therapy: No Prior Therapy Dates: NA Prior Therapy Facilty/Provider(s): NA Reason for Treatment: NA Prior Outpatient Therapy: Prior Outpatient Therapy: No (In [place], nothing current. ) Prior Therapy Dates: NA Prior Therapy Facilty/Provider(s): NA Reason for Treatment: NA Does patient have an ACCT team?: No Does patient have Intensive In-House Services?  : No Does patient have [facility] services? : No Does patient have P4CC services?: No  Past  Medical History:  Past Medical History:  Diagnosis Date  . Kidney stones   . PCOS (polycystic ovarian syndrome)   . Scoliosis   . Urinary tract infection     Past Surgical History:  Procedure Laterality Date  . GASTRIC BYPASS    . TONSILLECTOMY     Family History:  Family History  Problem Relation Age of Onset  . Hyperlipidemia Mother   . Diabetes Other   . Hyperlipidemia Other   . Stroke Other   . Heart failure Other    Family Psychiatric  History: denies Social History:  History  Alcohol Use  . Yes    Comment: occ     History  Drug Use  . Types: Marijuana    Comment: last used x 3 wks ago    Social History   Social History  . Marital status: Single    Spouse name: N/A  . Number of children: N/A  . Years of education: N/A   Social History Main Topics  . Smoking status: Current Some Day Smoker    Types: Cigarettes  . Smokeless tobacco: Never Used  . Alcohol use Yes     Comment: occ  . Drug use:     Types: Marijuana     Comment: last used x 3 wks ago  . Sexual activity: Yes   Other Topics Concern  .  Not on file   Social History Narrative  . No narrative on file   Additional Social History:    Allergies:   Allergies  Allergen Reactions  . Other     mqysma -swelling    Labs:  Results for orders placed or performed during the hospital encounter of 04/11/16 (from the past 48 hour(s))  Comprehensive metabolic panel     Status: None   Collection Time: 04/11/16  9:09 PM  Result Value Ref Range   Sodium 139 135 - 145 mmol/L   Potassium 3.9 3.5 - 5.1 mmol/L   Chloride 105 101 - 111 mmol/L   CO2 25 22 - 32 mmol/L   Glucose, Bld 84 65 - 99 mg/dL   BUN 15 6 - 20 mg/dL   Creatinine, Ser 7.42 0.44 - 1.00 mg/dL   Calcium 13.2 8.9 - 13.9 mg/dL   Total Protein 8.1 6.5 - 8.1 g/dL   Albumin 5.0 3.5 - 5.0 g/dL   AST 19 15 - 41 U/L   ALT 16 14 - 54 U/L   Alkaline Phosphatase 65 38 - 126 U/L   Total Bilirubin 1.0 0.3 - 1.2 mg/dL   GFR calc non Af Amer  >60 >60 mL/min   GFR calc Af Amer >60 >60 mL/min    Comment: (NOTE) The eGFR has been calculated using the CKD EPI equation. This calculation has not been validated in all clinical situations. eGFR's persistently <60 mL/min signify possible Chronic Kidney Disease.    Anion gap 9 5 - 15  Ethanol     Status: None   Collection Time: 04/11/16  9:09 PM  Result Value Ref Range   Alcohol, Ethyl (B) <5 <5 mg/dL    Comment:        LOWEST DETECTABLE LIMIT FOR SERUM ALCOHOL IS 5 mg/dL FOR MEDICAL PURPOSES ONLY   Salicylate level     Status: None   Collection Time: 04/11/16  9:09 PM  Result Value Ref Range   Salicylate Lvl <7.0 2.8 - 30.0 mg/dL  Acetaminophen level     Status: Abnormal   Collection Time: 04/11/16  9:09 PM  Result Value Ref Range   Acetaminophen (Tylenol), Serum <10 (L) 10 - 30 ug/mL    Comment:        THERAPEUTIC CONCENTRATIONS VARY SIGNIFICANTLY. A RANGE OF 10-30 ug/mL MAY BE AN EFFECTIVE CONCENTRATION FOR MANY PATIENTS. HOWEVER, SOME ARE BEST TREATED AT CONCENTRATIONS OUTSIDE THIS RANGE. ACETAMINOPHEN CONCENTRATIONS >150 ug/mL AT 4 HOURS AFTER INGESTION AND >50 ug/mL AT 12 HOURS AFTER INGESTION ARE OFTEN ASSOCIATED WITH TOXIC REACTIONS.   cbc     Status: None   Collection Time: 04/11/16  9:09 PM  Result Value Ref Range   WBC 10.1 4.0 - 10.5 K/uL   RBC 4.96 3.87 - 5.11 MIL/uL   Hemoglobin 14.9 12.0 - 15.0 g/dL   HCT 43.8 48.0 - 85.3 %   MCV 87.7 78.0 - 100.0 fL   MCH 30.0 26.0 - 34.0 pg   MCHC 34.3 30.0 - 36.0 g/dL   RDW 18.3 30.8 - 35.7 %   Platelets 364 150 - 400 K/uL  Rapid urine drug screen (hospital performed)     Status: Abnormal   Collection Time: 04/11/16  9:15 PM  Result Value Ref Range   Opiates NONE DETECTED NONE DETECTED   Cocaine NONE DETECTED NONE DETECTED   Benzodiazepines NONE DETECTED NONE DETECTED   Amphetamines NONE DETECTED NONE DETECTED   Tetrahydrocannabinol POSITIVE (A) NONE DETECTED   Barbiturates  NONE DETECTED NONE DETECTED     Comment:        DRUG SCREEN FOR MEDICAL PURPOSES ONLY.  IF CONFIRMATION IS NEEDED FOR ANY PURPOSE, NOTIFY LAB WITHIN 5 DAYS.        LOWEST DETECTABLE LIMITS FOR URINE DRUG SCREEN Drug Class       Cutoff (ng/mL) Amphetamine      1000 Barbiturate      200 Benzodiazepine   740 Tricyclics       814 Opiates          300 Cocaine          300 THC              50   POC urine preg, ED     Status: None   Collection Time: 04/11/16  9:21 PM  Result Value Ref Range   Preg Test, Ur NEGATIVE NEGATIVE    Comment:        THE SENSITIVITY OF THIS METHODOLOGY IS >24 mIU/mL     No current facility-administered medications for this encounter.    Current Outpatient Prescriptions  Medication Sig Dispense Refill  . ondansetron (ZOFRAN ODT) 8 MG disintegrating tablet Take 1 tablet (8 mg total) by mouth every 8 (eight) hours as needed for nausea or vomiting. 12 tablet 0  . oxyCODONE-acetaminophen (PERCOCET/ROXICET) 5-325 MG tablet Take 1 tablet by mouth every 4 (four) hours as needed for severe pain. 15 tablet 0  . ibuprofen (ADVIL,MOTRIN) 400 MG tablet Take 1 tablet (400 mg total) by mouth every 8 (eight) hours as needed. (Patient not taking: Reported on 04/11/2016) 15 tablet 0  . tamsulosin (FLOMAX) 0.4 MG CAPS capsule Take 1 capsule (0.4 mg total) by mouth daily. (Patient not taking: Reported on 04/11/2016) 10 capsule 0    Musculoskeletal: Strength & Muscle Tone: within normal limits Gait & Station: normal Patient leans: N/A  Psychiatric Specialty Exam: Physical Exam  Review of Systems  Psychiatric/Behavioral: Positive for depression and suicidal ideas. The patient is nervous/anxious and has insomnia.   All other systems reviewed and are negative.   Blood pressure 103/61, pulse (!) 53, temperature 98.7 F (37.1 C), temperature source Oral, resp. rate 17, height '5\' 2"'$  (1.575 m), weight 52.2 kg (115 lb), last menstrual period 03/14/2016, SpO2 97 %.Body mass index is 21.03 kg/m.  General  Appearance: Casual and Fairly Groomed  Eye Contact:  Good  Speech:  Clear and Coherent and Normal Rate  Volume:  Normal  Mood:  Anxious and Depressed  Affect:  Congruent, Depressed and Tearful  Thought Process:  Coherent, Goal Directed, Linear and Descriptions of Associations: Intact  Orientation:  Full (Time, Place, and Person)  Thought Content:  Symptoms, worries, concerns, rumination about ex boyfriend, rumination about family mistreating her verbally  Suicidal Thoughts:  Yes per IVC with suicidal text messages  Homicidal Thoughts:  No  Memory:  Immediate;   Fair Recent;   Fair Remote;   Fair  Judgement:  Fair  Insight:  Fair  Psychomotor Activity:  Normal  Concentration:  Concentration: Fair and Attention Span: Fair  Recall:  [employer] of Knowledge:  Fair  Language:  Fair  Akathisia:  No  Handed:    AIMS (if indicated):     Assets:  Communication Skills Desire for Improvement Resilience Social Support  ADL's:  Intact  Cognition:  WNL  Sleep:      Treatment Plan Summary: MDD (major depressive disorder), recurrent severe, without psychosis ([place]) unstable, warrants inpatient admission.  Medications:  -Remeron 7.'5mg'$  po qhs (depression, insomnia) -Vistaril '25mg'$  po q6h prn anxiety  Disposition: Recommend psychiatric Inpatient admission when medically cleared.  Benjamine Mola, FNP 04/12/2016 11:35 AM  Patient seen face-to-face for psychiatric evaluation, chart reviewed and case discussed with the physician extender and developed treatment plan. Reviewed the information documented and agree with the treatment plan. Corena Pilgrim, MD

## 2016-04-12 NOTE — Progress Notes (Signed)
Colleen CornfieldStephanie is a 20 year old female being admitted to [facility] from [facility].  She presented to the ED via ambulance called by her brother after she called him making statements about wanting to no be around.  He thought she took some pills in which she adamantly denies that she took anything.  She denied SI/HI or A/V hallucinations.  She did voice that she realizes that she needs help because she wants to be able to cope better.  "I really want to be followed outside the hospital."  She reported that she had gastric bypass last December and has lost 130 pounds.  She had the surgery done because she wasn't able to lose weight because she has Polycystic Ovarian Syndrome.  She denies any physical complaints and appears to be in no physical distress.  Oriented her tot he unit.  Admission paperwork completed and signed.  Belongings searched and secured in locker # 47.  Skin assessment completed and no skin issues noted.  Q 15 minute checks initiated for safety.  We will monitor the progress towards her goals.

## 2016-04-12 NOTE — ED Notes (Signed)
PT belonging in locker [id number]

## 2016-04-12 NOTE — ED Notes (Signed)
TTS counselor reports they spoke to pt's mom,and decision was made to have pt stay overnight for AM psych evaluation, which pt has agreed upon. Pt is tearful, but agreeable to have evaluation done.

## 2016-04-12 NOTE — Progress Notes (Signed)
Patient attended AA group meeting.  

## 2016-04-12 NOTE — Tx Team (Signed)
Initial Treatment Plan 04/12/2016 7:49 PM Colleen BearStephanie A Morrison ZOX:[id number]    PATIENT STRESSORS: Financial difficulties Marital or family conflict Occupational concerns   PATIENT STRENGTHS: Wellsite geologistCommunication skills General fund of knowledge Motivation for treatment/growth   PATIENT IDENTIFIED PROBLEMS: Depression  Anxiety  Suicidal ideation  "Find ways to calm myself down when I am overwhelmed"  "Not having to resort to suicidal thoughts"             DISCHARGE CRITERIA:  Improved stabilization in mood, thinking, and/or behavior Verbal commitment to aftercare and medication compliance  PRELIMINARY DISCHARGE PLAN: Outpatient therapy Medication management  PATIENT/FAMILY INVOLVEMENT: This treatment plan has been presented to and reviewed with the patient, Colleen Morrison.  The patient and family have been given the opportunity to ask questions and make suggestions.  Levin BaconHeather V Carrel Leather, RN 04/12/2016, 7:49 PM

## 2016-04-13 DIAGNOSIS — Z9889 Other specified postprocedural states: Secondary | ICD-10-CM

## 2016-04-13 DIAGNOSIS — Z823 Family history of stroke: Secondary | ICD-10-CM

## 2016-04-13 DIAGNOSIS — Z833 Family history of diabetes mellitus: Secondary | ICD-10-CM

## 2016-04-13 DIAGNOSIS — Z8249 Family history of ischemic heart disease and other diseases of the circulatory system: Secondary | ICD-10-CM

## 2016-04-13 DIAGNOSIS — F411 Generalized anxiety disorder: Secondary | ICD-10-CM

## 2016-04-13 DIAGNOSIS — Z8489 Family history of other specified conditions: Secondary | ICD-10-CM

## 2016-04-13 DIAGNOSIS — Z62820 Parent-biological child conflict: Secondary | ICD-10-CM

## 2016-04-13 DIAGNOSIS — F332 Major depressive disorder, recurrent severe without psychotic features: Principal | ICD-10-CM

## 2016-04-13 DIAGNOSIS — Z79899 Other long term (current) drug therapy: Secondary | ICD-10-CM

## 2016-04-13 NOTE — Plan of Care (Signed)
Problem: Medication: Goal: Compliance with prescribed medication regimen will improve Outcome: Progressing Patient was able to take her medications as ordered this shift.

## 2016-04-13 NOTE — Progress Notes (Signed)
Patient did attend the evening speaker AA meeting.  

## 2016-04-13 NOTE — H&P (Signed)
Psychiatric Admission Assessment Adult  Patient Identification: Colleen Morrison MRN:  [id number] Date of Evaluation:  04/13/2016 Chief Complaint:  MDD SEC SEV Principal Diagnosis: <principal problem not specified> Diagnosis:   Patient Active Problem List   Diagnosis Date Noted  . MDD (major depressive disorder), recurrent severe, without psychosis ([place]) [F33.2] 04/12/2016  . MDD (major depressive disorder), recurrent episode, severe ([place]) [F33.2] 04/12/2016   History of Present Illness: "Colleen Morrison a 20 y.o.femalewith PMH of depression not on medication with no prior suicide attempts presents to the emergency department for evaluation of suicidal thoughts and worsening depression. She has recently moved back to [place]. Has had conflicts at her parents house and was feeling low and depressed with suicidal thoughts. Informed her brother which brought  Her to hospital. Says it was silly she would not harm herself. She is looking forward for her new job Monday.  Denies psychotic or manic symptom. Denies drug use.  She has had gastric bypass surgery last year and was counselled for depression she has lost weight and does feel good of herself. Still not sure why she felt that low yesterday says it may have been my period " I do get irritable before that" Overall improved mood, denies suicidal thoughs as of now.  Slept somewhat poor. Energy, concentration adequate/   Associated Signs/Symptoms: Depression Symptoms:  depressed mood, anhedonia, fatigue, difficulty concentrating, (Hypo) Manic Symptoms:  Distractibility, Anxiety Symptoms:  Excessive Worry, Psychotic Symptoms: denies PTSD Symptoms: NA Total Time spent with patient: 1 hour  Past Psychiatric History: therapy treatment during after gastric bypass surgery   Is the patient at risk to self? No.  Has the patient been a risk to self in the past 6 months? Yes.    Has the patient been a risk to self within the distant  past? No.  Is the patient a risk to others? No.  Has the patient been a risk to others in the past 6 months? No.  Has the patient been a risk to others within the distant past? No.   Prior Inpatient Therapy:   Prior Outpatient Therapy:    Alcohol Screening: 1. How often do you have a drink containing alcohol?: Never 9. Have you or someone else been injured as a result of your drinking?: No 10. Has a relative or friend or a doctor or another health worker been concerned about your drinking or suggested you cut down?: No Alcohol Use Disorder Identification Test Final Score (AUDIT): 0 Brief Intervention: AUDIT score less than 7 or less-screening does not suggest unhealthy drinking-brief intervention not indicated Substance Abuse History in the last 12 months:  No. Consequences of Substance Abuse: NA Previous Psychotropic Medications: No  Psychological Evaluations: No  Past Medical History:  Past Medical History:  Diagnosis Date  . Kidney stones   . PCOS (polycystic ovarian syndrome)   . Scoliosis   . Urinary tract infection     Past Surgical History:  Procedure Laterality Date  . GASTRIC BYPASS    . TONSILLECTOMY     Family History:  Family History  Problem Relation Age of Onset  . Hyperlipidemia Mother   . Diabetes Other   . Hyperlipidemia Other   . Stroke Other   . Heart failure Other    Family Psychiatric  History: denies Tobacco Screening: Have you used any form of tobacco in the last 30 days? (Cigarettes, Smokeless Tobacco, Cigars, and/or Pipes): No Social History:  History  Alcohol Use  . Yes  Comment: occ     History  Drug Use  . Types: Marijuana    Comment: last used x 3 wks ago    Additional Social History:      Pain Medications: See MAR Prescriptions: See MAR Over the Counter: See MAR History of alcohol / drug use?: Yes Name of Substance 1: Marijuana  1 - Age of First Use: UTA 1 - Amount (size/oz): UTA 1 - Frequency: UTA 1 - Duration: UTA 1 -  Last Use / Amount: UTA                  Allergies:   Allergies  Allergen Reactions  . Other     mqysma -swelling   Lab Results:  Results for orders placed or performed during the hospital encounter of 04/11/16 (from the past 48 hour(s))  Comprehensive metabolic panel     Status: None   Collection Time: 04/11/16  9:09 PM  Result Value Ref Range   Sodium 139 135 - 145 mmol/L   Potassium 3.9 3.5 - 5.1 mmol/L   Chloride 105 101 - 111 mmol/L   CO2 25 22 - 32 mmol/L   Glucose, Bld 84 65 - 99 mg/dL   BUN 15 6 - 20 mg/dL   Creatinine, Ser 0.85 0.44 - 1.00 mg/dL   Calcium 10.1 8.9 - 10.3 mg/dL   Total Protein 8.1 6.5 - 8.1 g/dL   Albumin 5.0 3.5 - 5.0 g/dL   AST 19 15 - 41 U/L   ALT 16 14 - 54 U/L   Alkaline Phosphatase 65 38 - 126 U/L   Total Bilirubin 1.0 0.3 - 1.2 mg/dL   GFR calc non Af Amer >60 >60 mL/min   GFR calc Af Amer >60 >60 mL/min    Comment: (NOTE) The eGFR has been calculated using the CKD EPI equation. This calculation has not been validated in all clinical situations. eGFR's persistently <60 mL/min signify possible Chronic Kidney Disease.    Anion gap 9 5 - 15  Ethanol     Status: None   Collection Time: 04/11/16  9:09 PM  Result Value Ref Range   Alcohol, Ethyl (B) <5 <5 mg/dL    Comment:        LOWEST DETECTABLE LIMIT FOR SERUM ALCOHOL IS 5 mg/dL FOR MEDICAL PURPOSES ONLY   Salicylate level     Status: None   Collection Time: 04/11/16  9:09 PM  Result Value Ref Range   Salicylate Lvl <8.5 2.8 - 30.0 mg/dL  Acetaminophen level     Status: Abnormal   Collection Time: 04/11/16  9:09 PM  Result Value Ref Range   Acetaminophen (Tylenol), Serum <10 (L) 10 - 30 ug/mL    Comment:        THERAPEUTIC CONCENTRATIONS VARY SIGNIFICANTLY. A RANGE OF 10-30 ug/mL MAY BE AN EFFECTIVE CONCENTRATION FOR MANY PATIENTS. HOWEVER, SOME ARE BEST TREATED AT CONCENTRATIONS OUTSIDE THIS RANGE. ACETAMINOPHEN CONCENTRATIONS >150 ug/mL AT 4 HOURS AFTER INGESTION AND  >50 ug/mL AT 12 HOURS AFTER INGESTION ARE OFTEN ASSOCIATED WITH TOXIC REACTIONS.   cbc     Status: None   Collection Time: 04/11/16  9:09 PM  Result Value Ref Range   WBC 10.1 4.0 - 10.5 K/uL   RBC 4.96 3.87 - 5.11 MIL/uL   Hemoglobin 14.9 12.0 - 15.0 g/dL   HCT 43.5 36.0 - 46.0 %   MCV 87.7 78.0 - 100.0 fL   MCH 30.0 26.0 - 34.0 pg   MCHC 34.3 30.0 -  36.0 g/dL   RDW 12.6 11.5 - 15.5 %   Platelets 364 150 - 400 K/uL  Rapid urine drug screen (hospital performed)     Status: Abnormal   Collection Time: 04/11/16  9:15 PM  Result Value Ref Range   Opiates NONE DETECTED NONE DETECTED   Cocaine NONE DETECTED NONE DETECTED   Benzodiazepines NONE DETECTED NONE DETECTED   Amphetamines NONE DETECTED NONE DETECTED   Tetrahydrocannabinol POSITIVE (A) NONE DETECTED   Barbiturates NONE DETECTED NONE DETECTED    Comment:        DRUG SCREEN FOR MEDICAL PURPOSES ONLY.  IF CONFIRMATION IS NEEDED FOR ANY PURPOSE, NOTIFY LAB WITHIN 5 DAYS.        LOWEST DETECTABLE LIMITS FOR URINE DRUG SCREEN Drug Class       Cutoff (ng/mL) Amphetamine      1000 Barbiturate      200 Benzodiazepine   657 Tricyclics       846 Opiates          300 Cocaine          300 THC              50   POC urine preg, ED     Status: None   Collection Time: 04/11/16  9:21 PM  Result Value Ref Range   Preg Test, Ur NEGATIVE NEGATIVE    Comment:        THE SENSITIVITY OF THIS METHODOLOGY IS >24 mIU/mL     Blood Alcohol level:  Lab Results  Component Value Date   ETH <5 96/29/5284    Metabolic Disorder Labs:  No results found for: HGBA1C, MPG No results found for: PROLACTIN No results found for: CHOL, TRIG, HDL, CHOLHDL, VLDL, LDLCALC  Current Medications: Current Facility-Administered Medications  Medication Dose Route Frequency Provider Last Rate Last Dose  . acetaminophen (TYLENOL) tablet 650 mg  650 mg Oral Q6H PRN Niel Hummer, NP   650 mg at 04/12/16 1948  . alum & mag hydroxide-simeth  (MAALOX/MYLANTA) 200-200-20 MG/5ML suspension 30 mL  30 mL Oral Q4H PRN Niel Hummer, NP      . hydrOXYzine (ATARAX/VISTARIL) tablet 25 mg  25 mg Oral Q6H PRN Niel Hummer, NP   25 mg at 04/12/16 1948  . magnesium hydroxide (MILK OF MAGNESIA) suspension 30 mL  30 mL Oral Daily PRN Niel Hummer, NP      . mirtazapine (REMERON) tablet 7.5 mg  7.5 mg Oral QHS Niel Hummer, NP   7.5 mg at 04/12/16 2151   PTA Medications: Prescriptions Prior to Admission  Medication Sig Dispense Refill Last Dose  . ibuprofen (ADVIL,MOTRIN) 400 MG tablet Take 1 tablet (400 mg total) by mouth every 8 (eight) hours as needed. (Patient not taking: Reported on 04/11/2016) 15 tablet 0 Not Taking at Unknown time  . ondansetron (ZOFRAN ODT) 8 MG disintegrating tablet Take 1 tablet (8 mg total) by mouth every 8 (eight) hours as needed for nausea or vomiting. 12 tablet 0 Past Week at Unknown time  . oxyCODONE-acetaminophen (PERCOCET/ROXICET) 5-325 MG tablet Take 1 tablet by mouth every 4 (four) hours as needed for severe pain. 15 tablet 0 Past Week at Unknown time  . tamsulosin (FLOMAX) 0.4 MG CAPS capsule Take 1 capsule (0.4 mg total) by mouth daily. (Patient not taking: Reported on 04/11/2016) 10 capsule 0 Not Taking at Unknown time    Musculoskeletal: Strength & Muscle Tone: within normal limits Gait & Station: normal Patient  leans: no lean  Psychiatric Specialty Exam: Physical Exam  Constitutional: No distress.  HENT:  Head: Normocephalic.  Skin: She is not diaphoretic.    Review of Systems  Cardiovascular: Negative for chest pain.  Gastrointestinal: Negative for nausea.  Psychiatric/Behavioral: Positive for depression. Negative for suicidal ideas. The patient is nervous/anxious.     Blood pressure 94/63, pulse (!) 52, temperature 97.7 F (36.5 C), resp. rate 16, height '5\' 2"'$  (1.575 m), weight 51.7 kg (114 lb), last menstrual period 03/14/2016, SpO2 100 %.Body mass index is 20.85 kg/m.  General Appearance:  Casual  Eye Contact:  Fair  Speech:  Normal Rate  Volume:  Normal  Mood:  Dysphoric  Affect:  Congruent  Thought Process:  Goal Directed  Orientation:  Full (Time, Place, and Person)  Thought Content:  Rumination  Suicidal Thoughts:  No  Homicidal Thoughts:  No  Memory:  Immediate;   Fair Recent;   Fair  Judgement:  Fair  Insight:  Shallow  Psychomotor Activity:  Normal  Concentration:  Concentration: Fair and Attention Span: Fair  Recall:  [employer] of Knowledge:  Good  Language:  Good  Akathisia:  Negative  Handed:  Right  AIMS (if indicated):     Assets:  Desire for Improvement  ADL's:  Intact  Cognition:  WNL  Sleep:  Number of Hours: 6.5    Treatment Plan Summary: Daily contact with patient to assess and evaluate symptoms and progress in treatment, Medication management and Plan as follows  Observation Level/Precautions:  15 minute checks  Laboratory:  as needed  Psychotherapy:  As per in unit  Medications:  See chart  Consultations:    Discharge Concerns:    Estimated LOS: 3-5 days  Other:     Physician Treatment Plan for Primary Diagnosis: <principal problem not specified> Long Term Goal(s): Improvement in symptoms so as ready for discharge  Short Term Goals: Ability to verbalize feelings will improve, Ability to disclose and discuss suicidal ideas, Ability to demonstrate self-control will improve, Ability to identify and develop effective coping behaviors will improve and Ability to maintain clinical measurements within normal limits will improve Will continue remeron small dose for depression and sleep.   Physician Treatment Plan for Secondary Diagnosis: Active Problems:   MDD (major depressive disorder), recurrent episode, severe ([place])  Long Term Goal(s): Improvement in symptoms so as ready for discharge and Compliance with medications and wokring on coping skills. follow up with behavioural health  Short Term Goals: Ability to verbalize feelings will  improve, Ability to disclose and discuss suicidal ideas and Ability to demonstrate self-control will improve  I certify that inpatient services furnished can reasonably be expected to improve the patient's condition.    Merian Capron, MD 12/2/201710:29 AM

## 2016-04-13 NOTE — [facility] Group Notes (Signed)
Adult Psychoeducational Group Note  Date:  04/13/2016 Time:  4:22 PM  Group Topic/Focus:  Identifying Needs:   The focus of this group is to help patients identify their personal needs that have been historically problematic and identify healthy behaviors to address their needs.   Participation Level:  Active  Participation Quality:  Appropriate and Attentive  Affect:  Appropriate  Cognitive:  Alert and Appropriate  Insight: Good  Engagement in Group:  Engaged  Modes of Intervention:  Discussion  Additional Comments:  Pt was engaged in the group and discussed changing negative thoughts to a positive.  Lauris Poagndrea B Shemia Bevel 04/13/2016, 4:22 PM

## 2016-04-13 NOTE — Progress Notes (Signed)
Nursing Progress Note: 7p-7a D: Pt currently presents with a pleasant/happy affect and behavior. Pt reports to writer that their goal is to "get back to normal." Pt states "it has been a really great day here, but I'm ready to go home." Pt reports good sleep with current medication regimen.   A: Pt provided with medications per providers orders. Pt's labs and vitals were monitored throughout the night. Pt supported emotionally and encouraged to express concerns and questions. Pt educated on medications.  R: Pt's safety ensured with 15 minute and environmental checks. Pt currently denies SI/HI/Self Harm and A/V hallucinations. Pt verbally agrees to seek staff if SI/HI or A/VH occurs and to consult with staff before acting on any harmful thoughts. Will continue POC.

## 2016-04-13 NOTE — [facility] Suicide Risk Assessment (Signed)
[facility]   Nursing information obtained from:  Patient Demographic factors:  Adolescent or young adult, Caucasian, Living alone Current Mental Status:  NA Loss Factors:  Loss of significant relationship Historical Factors:  NA Risk Reduction Factors:  Sense of responsibility to family  Total Time spent with patient: 1 hour Principal Problem: <principal problem not specified> Diagnosis:   Patient Active Problem List   Diagnosis Date Noted  . MDD (major depressive disorder), recurrent severe, without psychosis ([facility]) [F33.2] 04/12/2016  . MDD (major depressive disorder), recurrent episode, severe ([facility]) [F33.2] 04/12/2016   Subjective Data: Alert oriented, presented with depression and hopelessness.  Continued Clinical Symptoms:  Alcohol Use Disorder Identification Test Final Score (AUDIT): 0 The "Alcohol Use Disorders Identification Test", Guidelines for Use in Primary Care, Second Edition.  World Science writerHealth Organization [facility](WHO). Score between 0-7:  no or low risk or alcohol related problems. Score between 8-15:  moderate risk of alcohol related problems. Score between 16-19:  high risk of alcohol related problems. Score 20 or above:  warrants further diagnostic evaluation for alcohol dependence and treatment.   CLINICAL FACTORS:   Dysthymia More than one psychiatric diagnosis Unstable or Poor Therapeutic Relationship   Musculoskeletal: Strength & Muscle Tone: within normal limits Gait & Station: normal Patient leans: no lean  Psychiatric Specialty Exam: Physical Exam  Review of Systems  Cardiovascular: Negative for chest pain.  Psychiatric/Behavioral: Positive for depression. Negative for substance abuse. The patient is nervous/anxious.     Blood pressure 94/63, pulse (!) 52, temperature 97.7 F (36.5 C), resp. rate 16, height 5\' 2"  (1.575 m), weight 51.7 kg (114 lb), last menstrual period 03/14/2016, SpO2 100 %.Body mass index is 20.85 kg/m.   General Appearance: Casual  Eye Contact:  Fair  Speech:  Normal Rate  Volume:  Decreased  Mood:  Dysphoric  Affect:  Congruent  Thought Process:  Goal Directed  Orientation:  Full (Time, Place, and Person)  Thought Content:  Rumination  Suicidal Thoughts:  No  Homicidal Thoughts:  No  Memory:  Immediate;   Fair Recent;   Fair  Judgement:  Fair  Insight:  Shallow  Psychomotor Activity:  Normal  Concentration:  Concentration: Fair and Attention Span: Fair  Recall:  [employer]  Fund of Knowledge:  Good  Language:  Good  Akathisia:  Negative  Handed:  Right  AIMS (if indicated):     Assets:  Desire for Improvement  ADL's:  Intact  Cognition:  WNL  Sleep:  Number of Hours: 6.5      COGNITIVE FEATURES THAT CONTRIBUTE TO RISK:  Closed-mindedness    SUICIDE RISK:   Moderate:  Frequent suicidal ideation with limited intensity, and duration, some specificity in terms of plans, no associated intent, good self-control, limited dysphoria/symptomatology, some risk factors present, and identifiable protective factors, including available and accessible social support.   PLAN OF CARE: Admit for stabilization. Safety and depression.manage medications  I certify that inpatient services furnished can reasonably be expected to improve the patient's condition.  Thresa RossAKHTAR, Aleasha Fregeau, MD 04/13/2016, 10:39 AM

## 2016-04-13 NOTE — Progress Notes (Signed)
Data. Patient denies SI/HI/AVH. Patient interacting well with staff and other patients out in the milieu of the unit. Patient affect is blunt, but brightens with approach. On her self assessment patient reports 1/10 for depression, 0/10 for hopelessness and 3/10 for anxiety. Her goal is: "How to avoid having suicidal thoughts/being less depressed".  Action. Emotional support and encouragement offered. Education provided on medication, indications and side effect. Q 15 minute checks done for safety. Response. Safety on the unit maintained through 15 minute checks.  Medications taken as prescribed. Attended groups. Remained calm and appropriate through out shift.

## 2016-04-13 NOTE — [facility] Group Notes (Signed)
[facility] Group Notes:  (Nursing/MHT/Case Management/Adjunct)  Date:  04/13/2016  Time:  12:20 PM  Type of Therapy:  Nurse Education  Participation Level:  Active  Participation Quality:  Appropriate and Attentive  Affect:  Blunted  Cognitive:  Alert and Appropriate  Insight:  Appropriate  Engagement in Group:  Engaged  Modes of Intervention:  Education  Summary of Progress/Problems: Patient was able to state her goal to day as, "Find a way to cope with my sadness and depression".  She was able to develop a positive affirmation to repeat throughout the day.  Colleen Barenny G Colleen Morrison 04/13/2016, 12:20 PM

## 2016-04-13 NOTE — Progress Notes (Signed)
D: Pt at the time of assessment endorses worrying however; denies any form of depression, anxiety, pain, SI, HI or AVH; "I am better than when I came; I understand thing better now." Pt is flat and withdrawn to self. Pt remained calm and cooperative. A: Medications offered as prescribed.  Support, encouragement, and safe environment provided.  15-minute safety checks continue. R: Pt was med compliant.  Pt attended AA group. Safety checks continue.

## 2016-04-13 NOTE — [facility] Group Notes (Signed)
Adult Therapy Group Note  Date:  04/13/2016  Time:  10:00-11:00AM  Group Topic/Focus: Fears and Healthy/Unhealthy Coping Skills  Building Self Esteem:   The Focus of this group was to discuss some of the prevalent fears that patients experience, and to identify the commonalities among group members.  An exercise was used to initiate the discussion, followed by writing on the white board a group-generated list of unhealthy coping and healthy coping techniques to deal with each fear.  This assisted patients in becoming aware of the differences between healthy and unhealthy coping techniques, as well as how to determine which type they are using.   Reasons for choosing the unhealthy techniques were explored, which helped patients to provide support to each other and to determine that, in fact, they are not alone.    Participation Level:  Active  Participation Quality:  Attentive, Sharing and Supportive  Affect:  Appropriate  Cognitive:  Alert and Oriented  Insight: Good  Engagement in Group:  Engaged  Modes of Intervention:  Discussion, Exploration and Support  Additional Comments:  The patient expressed herself throughout group and was insightful.  Ambrose MantleMareida Grossman-Orr, LCSW 04/13/2016   12:35pm
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
## 2016-04-14 DIAGNOSIS — Z62820 Parent-biological child conflict: Secondary | ICD-10-CM

## 2016-04-14 DIAGNOSIS — F411 Generalized anxiety disorder: Secondary | ICD-10-CM

## 2016-04-14 MED ORDER — MIRTAZAPINE 15 MG PO TABS: 15.0000 mg | ORAL_TABLET | Freq: Every day | ORAL | Status: DC

## 2016-04-14 MED ORDER — HYDROXYZINE HCL 25 MG PO TABS: 25.0000 mg | ORAL_TABLET | Freq: Four times a day (QID) | ORAL | 0 refills | Status: DC | PRN

## 2016-04-14 MED ORDER — MIRTAZAPINE 15 MG PO TABS: 15.0000 mg | ORAL_TABLET | Freq: Every day | ORAL | 0 refills | Status: DC

## 2016-04-14 MED ORDER — ESCITALOPRAM OXALATE 10 MG PO TABS
10.0000 mg | ORAL_TABLET | Freq: Every day | ORAL | Status: DC
  Administered 2016-04-14: 10 mg via ORAL
  Filled 2016-04-14 (×3): qty 1

## 2016-04-14 MED ORDER — ESCITALOPRAM OXALATE 10 MG PO TABS: 10.0000 mg | ORAL_TABLET | Freq: Every day | ORAL | 0 refills | Status: DC

## 2016-04-14 NOTE — Progress Notes (Signed)
Data. Patient denies SI/HI/AVH. Patient interacting well with staff and other patients. Affect bright and patient being supportive of other patients. Reports she is looking forward to leaving today. She also reports that she spoke to a therapist for after she leaves, that she likes her and she is going to be setting up regular therapy appointments when she leaves. On her self assessment she reports 0/10 for all areas and her goal is: "How to stay positive".  Action. Emotional support and encouragement offered. Education provided on medication, indications and side effect. Q 15 minute checks done for safety. Response. Safety on the unit maintained through 15 minute checks.  Medications taken as prescribed. Attended groups. Remained calm and appropriate through out shift.

## 2016-04-14 NOTE — BHH Suicide Risk Assessment (Signed)
[facility]   Principal Problem: <principal problem not specified> Discharge Diagnoses:  Patient Active Problem List   Diagnosis Date Noted  . MDD (major depressive disorder), recurrent severe, without psychosis (HCC) [F33.2] 04/12/2016  . MDD (major depressive disorder), recurrent episode, severe (HCC) [F33.2] 04/12/2016    Total Time spent with patient: 30 minutes  Musculoskeletal: Strength & Muscle Tone: within normal limits Gait & Station: normal Patient leans: N/A  Psychiatric Specialty Exam: Review of Systems  Cardiovascular: Negative for chest pain.  Skin: Negative for rash.  Psychiatric/Behavioral: Negative for depression and suicidal ideas.    Blood pressure (!) 117/99, pulse 63, temperature 98.6 F (37 C), temperature source Oral, resp. rate 16, height 5\' 2"  (1.575 m), weight 51.7 kg (114 lb), last menstrual period 03/14/2016, SpO2 100 %.Body mass index is 20.85 kg/m.  General Appearance: Casual  Eye Contact::  Minimal  Speech:  Normal Rate409  Volume:  Normal  Mood:  Euthymic  Affect:  Congruent  Thought Process:  Goal Directed  Orientation:  Full (Time, Place, and Person)  Thought Content:  Rumination  Suicidal Thoughts:  No  Homicidal Thoughts:  No  Memory:  Immediate;   Fair Recent;   Fair  Judgement:  Fair  Insight:  Fair  Psychomotor Activity:  Normal  Concentration:  Fair  Recall:  [employer]  Fund of Knowledge:Fair  Language: Fair  Akathisia:  Negative  Handed:  Right  AIMS (if indicated):     Assets:  Desire for Improvement  Sleep:  Number of Hours: 6  Cognition: WNL  ADL's:  Intact   Mental Status Per Nursing Assessment::   On Admission:  NA  Demographic Factors:  Adolescent or young adult  Loss Factors: Financial problems/change in socioeconomic status  Historical Factors: Impulsivity  Risk Reduction Factors:   Positive therapeutic relationship and Positive coping skills or problem solving skills  Continued  Clinical Symptoms:  Dysthymia  Cognitive Features That Contribute To Risk:  None    Suicide Risk:  Minimal: No identifiable suicidal ideation.  Patients presenting with no risk factors but with morbid ruminations; may be classified as minimal risk based on the severity of the depressive symptoms  Follow-up Information    [facility]   Specialty:  Behavioral Health Why:  If your mother's primary care doctor is unable to see you or write your mental health prescriptions, [facility] 7 days of discharge between 8:00AM-3:00PM.  You will be given appointments for assessment and psychiatric evaluation. Contact information: [address] [place] [address] [phone]         Follow up with recommendations and appointments. Abstain from marijuna. Will discharge with lexapro 5mg  increase to 10mg  qd.   Plan Of Care/Follow-up recommendations:  Activity:  as tolerated Diet:  regular  Gilmore LarocheAKHTAR, Dearius Hoffmann, MD 04/14/2016, 10:01 AM

## 2016-04-14 NOTE — Progress Notes (Signed)
  [facility] :  Will you be returning to the same living situation after discharge:  Yes,  back to apartment alone At discharge, do you have transportation home?: Yes,  family Do you have the ability to pay for your medications: Yes,  income and insurance  Release of information consent forms completed and in the chart;  Patient's signature needed at discharge.  Patient to Follow up at: Follow-up Information    [facility].   Specialty:  Behavioral Health Why:  If your mother's primary care doctor is unable to see you or write your mental health prescriptions, [facility] within 7 days of discharge between 8:00AM-3:00PM.  You will be given appointments for assessment and psychiatric evaluation. Contact information: [address] [place] [address] [phone]        Dr. Jillyn HiddenFulp. Schedule an appointment as soon as possible for a visit.   Why:  Call your mother's physician on Monday 12/4/1 to obtain an appointment to follow up with your medication. Contact information: [facility] [place]   [phone] N. [address], [address] [place], [place] [address] [phone]           Next level of care provider has access to [facility] Link:no  Safety Planning and Suicide Prevention discussed: Yes,  iwth patient and with her brother  Have you used any form of tobacco in the last 30 days? (Cigarettes, Smokeless Tobacco, Cigars, and/or Pipes): No  Has patient been referred to the Quitline?: N/A patient is not a smoker  Patient has been referred for addiction treatment: N/A  Lynnell ChadMareida J Grossman-Orr 04/14/2016, 11:27 AM

## 2016-04-14 NOTE — Discharge Summary (Signed)
Physician Discharge Summary Note  Patient:  Colleen FudgeStephanie A Morrison is an 20 y.o., female MRN:  [id number] DOB:  Dec 31, 1995 Patient phone:  [phone] (home)  Patient address[place]   [address] [place] [address],  Total Time spent with patient: Greater than 30 minutes  Date of Admission:  04/12/2016 Date of Discharge: 04-14-16  Reason for Admission: Worsening symptoms of depression.  Principal Problem: Major depressive disorder, recurrent episodes.  Discharge Diagnoses: Patient Active Problem List   Diagnosis Date Noted  . GAD (generalized anxiety disorder) [F41.1]   . Parent relationship problem [Z62.820]   . Severe episode of recurrent major depressive disorder, without psychotic features (HCC) [F33.2] 04/12/2016  . MDD (major depressive disorder), recurrent episode, severe (HCC) [F33.2] 04/12/2016   Past Psychiatric History: Major depression, Cannabis abuse.  Past Medical History:  Past Medical History:  Diagnosis Date  . Kidney stones   . PCOS (polycystic ovarian syndrome)   . Scoliosis   . Urinary tract infection     Past Surgical History:  Procedure Laterality Date  . GASTRIC BYPASS    . TONSILLECTOMY     Family History:  Family History  Problem Relation Age of Onset  . Hyperlipidemia Mother   . Diabetes Other   . Hyperlipidemia Other   . Stroke Other   . Heart failure Other    Family Psychiatric  History: See H&P  Social History:  History  Alcohol Use  . Yes    Comment: occ     History  Drug Use  . Types: Marijuana    Comment: last used x 3 wks ago    Social History   Social History  . Marital status: Single    Spouse name: N/A  . Number of children: N/A  . Years of education: N/A   Social History Main Topics  . Smoking status: Former Games developermoker  . Smokeless tobacco: Never Used  . Alcohol use Yes     Comment: occ  . Drug use:     Types: Marijuana     Comment: last used x 3 wks ago  . Sexual activity: Yes   Other Topics Concern  . None    Social History Narrative  . None   Hospital Course: Colleen BearStephanie A Masis a 20 y.o.femalewith PMH of depression, not on medication & with no prior suicide attempts presents to the emergency department for evaluation of suicidal thoughts and worsening depression. She has recently moved back to [place]. Has had conflicts at her parents house and was feeling low and depressed with suicidal thoughts. Informed her brother who brought her to hospital. She says it was silly she would not harm herself. She is looking forward to her new job on Monday. Denies psychotic or manic symptom. Denies drug use.   Marriah's stay in this hospital was rather very brief. She was admitted to the hospital with her UDS positive forTHC. She was complaining of suicidal ideations after recently moving back to [place] area & had a conflict with her parents. She got depressed & became suicidal & told her brother who encouraged her to seek treatment. Colleen Morrison, however, maintained that she was not planing on killing herself & does not have any history of suicide attempts.  After her admission assessment, she was started on Mirtazapine 7.5 mg for insomnia/depression including Hydroxyzine 25 mg prn for anxiety. Other than post gastric by-pass surgical procedure, Colleen Morrison presented with no significant medical issues that required treatment.   During her follow-up care assessment this morning, Colleen Morrison presented  with a good affect, good eye contact, is alert & oriented x 3. She is aware of situation & able to make concrete decisions/requests.  She has asked to be discharged today with a request to stop her Mirtazapine & start her on Lexapro for her depression. She maintained that she had bad nightmare with the use of Mirtazapine. She states that she has a new job she is starting tomorrow morning. After evaluation, the Md discontinued her Mirtazapine & initiated Lexapro 10 mg daily. She is currently mentally & medically stable. And  because there is no clinical criteria to keep [facility] admitted to the hospital, she is being discharged as requested to her place of residence. She is committed to following up psychiatric care on an outpatient basis.   Upon discharge, Colleen Morrison appears much more in control of her mood & behavior. Her symptoms were reported as significantly improved or completely resolved. There are currently, no active SI plans or intent, AVH, delusional thoughts or paranoia. She is going to pursue outpatient treatment in her own terms as noted below. She was provided with prescriptions. She left [facility] with all personal belongings in no apparent distress. Transportation per her family.  Physical Findings: AIMS: Facial and Oral Movements Muscles of Facial Expression: None, normal Lips and Perioral Area: None, normal Jaw: None, normal Tongue: None, normal,Extremity Movements Upper (arms, wrists, hands, fingers): None, normal Lower (legs, knees, ankles, toes): None, normal, Trunk Movements Neck, shoulders, hips: None, normal, Overall Severity Severity of abnormal movements (highest score from questions above): None, normal Incapacitation due to abnormal movements: None, normal Patient's awareness of abnormal movements (rate only patient's report): No Awareness, Dental Status Current problems with teeth and/or dentures?: No Does patient usually wear dentures?: No  CIWA:    COWS:     Musculoskeletal: Strength & Muscle Tone: within normal limits Gait & Station: normal Patient leans: N/A  Psychiatric Specialty Exam: Physical Exam  Constitutional: She is oriented to person, place, and time. She appears well-developed and well-nourished.  HENT:  Head: Normocephalic.  Eyes: Pupils are equal, round, and reactive to light.  Neck: Normal range of motion.  Cardiovascular: Normal rate.   Respiratory: Effort normal.  GI: Soft.  Genitourinary:  Genitourinary Comments: Denies any issues in this area   Musculoskeletal: Normal range of motion.  Neurological: She is alert and oriented to person, place, and time.  Skin: Skin is warm and dry.    Review of Systems  Constitutional: Negative.   HENT: Negative.   Eyes: Negative.   Respiratory: Negative.   Cardiovascular: Negative.   Gastrointestinal: Negative.   Genitourinary: Negative.   Musculoskeletal: Negative.   Skin: Negative.   Neurological: Negative.   Endo/Heme/Allergies: Negative.   Psychiatric/Behavioral: Positive for depression (Stable) and substance abuse (Hx. Cannabis abuse). Negative for hallucinations, memory loss and suicidal ideas. The patient has insomnia (Stable). The patient is not nervous/anxious.     Blood pressure (!) 117/99, pulse 63, temperature 98.6 F (37 C), temperature source Oral, resp. rate 16, height 5\' 2"  (1.575 m), weight 51.7 kg (114 lb), last menstrual period 03/14/2016, SpO2 100 %.Body mass index is 20.85 kg/m.  See Md's SRA   Have you used any form of tobacco in the last 30 days? (Cigarettes, Smokeless Tobacco, Cigars, and/or Pipes): No  Has this patient used any form of tobacco in the last 30 days? (Cigarettes, Smokeless Tobacco, Cigars, and/or Pipes): No  Blood Alcohol level:  Lab Results  Component Value Date   [facility] <5 04/11/2016  Metabolic Disorder Labs:  No results found for: HGBA1C, MPG No results found for: PROLACTIN No results found for: CHOL, TRIG, HDL, CHOLHDL, VLDL, LDLCALC  See Psychiatric Specialty Exam and Suicide Risk Assessment completed by Attending Physician prior to discharge.  Discharge destination:  Home  Is patient on multiple antipsychotic therapies at discharge:  No   Has Patient had three or more failed trials of antipsychotic monotherapy by history:  No  Recommended Plan for Multiple Antipsychotic Therapies: NA    Medication List    STOP taking these medications   ibuprofen 400 MG tablet Commonly known as:  ADVIL,MOTRIN   ondansetron 8 MG disintegrating  tablet Commonly known as:  ZOFRAN ODT   oxyCODONE-acetaminophen 5-325 MG tablet Commonly known as:  PERCOCET/ROXICET   tamsulosin 0.4 MG Caps capsule Commonly known as:  FLOMAX     TAKE these medications     Indication  escitalopram 10 MG tablet Commonly known as:  LEXAPRO Take 1 tablet (10 mg total) by mouth daily. For depression  Indication:  Major Depressive Disorder   hydrOXYzine 25 MG tablet Commonly known as:  ATARAX/VISTARIL Take 1 tablet (25 mg total) by mouth every 6 (six) hours as needed for anxiety.  Indication:  Anxiety Neurosis      Follow-up Information    [facility] Follow up.   Specialty:  Behavioral Health Why:  If your mother's primary care doctor is unable to see you or write your mental health prescriptions, [facility] within 7 days of discharge between 8:00AM-3:00PM.  You will be given appointments for assessment and psychiatric evaluation. Contact information: [address] [place] [address] [phone]        Dr. Jillyn HiddenFulp. Schedule an appointment as soon as possible for a visit.   Why:  Call your mother's physician on Monday 12/4/1 to obtain an appointment to follow up with your medication. Contact information: [facility] [place]   [phone] N. [address], [address] [place], [place] [address] [phone]          Follow-up recommendations: Activity:  As tolerated Diet: As recommended by your primary care doctor. Keep all scheduled follow-up appointments as recommended.   Comments: Patient is instructed prior to discharge to: Take all medications as prescribed by his/her mental healthcare provider. Report any adverse effects and or reactions from the medicines to his/her outpatient provider promptly. Patient has been instructed & cautioned: To not engage in alcohol and or illegal drug use while on prescription medicines. In the event of worsening symptoms, patient is instructed to call the crisis hotline, 911 and or go to the  nearest ED for appropriate evaluation and treatment of symptoms. To follow-up with his/her primary care provider for your other medical issues, concerns and or health care needs.   Signed: Sanjuana KavaNwoko, Agnes I, NP, PMHNP, FNP-BC 04/14/2016, 4:02 PM  I have examined the patient and agree with the discharge plan and findings. I also did suicide assessment on this patient.

## 2016-04-14 NOTE — BHH Group Notes (Signed)
BHH Group Notes:  (Clinical Social Work)  04/14/2016  10:00-11:00AM  Summary of Progress/Problems:   The main focus of today's process group was to   1)  discuss the importance of adding supports  2)  define health supports versus unhealthy supports  3)  identify the patient's current healthy unhealthy supports and   4)  Introduce tapping.  An emphasis was placed on using counselor, doctor, therapy groups, 12-step groups, and problem-specific support groups to expand supports.    The patient expressed full comprehension of the concepts presented, and agreed that there is a need to add more supports.  The patient stated marijuana is probably an unhealthy support in her life because it is still illegal, although she stated emphatically she does not see any other way of it being unhealthy, which led to a discussion.  She stated her family is a tremendous and healthy support for her.  She said the tapping exercise for sleep did relax her.  Type of Therapy:  Process Group  Participation Level:  Active  Participation Quality:  Attentive, Sharing and Supportive  Affect:  Appropriate  Cognitive:  Appropriate  Insight:  Engaged  Engagement in Therapy:  Engaged  Modes of Intervention:   Education, Support and Processing, Activity  Ambrose MantleMareida Grossman-Orr, LCSW 04/14/2016

## 2016-04-14 NOTE — [facility] Suicide Risk Assessment (Signed)
[facility] INPATIENT:  Family/Significant Other Suicide Prevention Education  Suicide Prevention Education:  Education Completed; Brother Lake BellsSean Nafziger at [phone] has been identified by the patient as the family member/significant other with whom the patient will be residing, and identified as the person(s) who will aid the patient in the event of a mental health crisis (suicidal ideations/suicide attempt).  With written consent from the patient, the family member/significant other has been provided the following suicide prevention education, prior to the and/or following the discharge of the patient.  The suicide prevention education provided includes the following:  Suicide risk factors  Suicide prevention and interventions  National Suicide Hotline telephone number  [facility] assessment telephone number  [facility] Emergency Assistance 911  [facility] and/or Residential Mobile Crisis Unit telephone number  Request made of family/significant other to:  Remove weapons (e.g., guns, rifles, knives), all items previously/currently identified as safety concern.    Remove drugs/medications (over-the-counter, prescriptions, illicit drugs), all items previously/currently identified as a safety concern.  The family member/significant other verbalizes understanding of the suicide prevention education information provided.  The family member/significant other agrees to remove the items of safety concern listed above.  Pt's brother stated he has been visiting her since her arrival at hospital, and feels she is doing much better.  He states that he does know, as pt said, there are no firearms in her arm, and her next-door neighbor/friend has guns, but they are locked up.  He would feel good about her discharge at this point.  Carloyn JaegerMareida J Grossman-Orr 04/14/2016, 8:38 AM

## 2016-04-14 NOTE — [facility] Group Notes (Signed)
[facility] Group Notes:  (Nursing/MHT/Case Management/Adjunct)  Date:  04/14/2016  Time:  3:59 PM  Type of Therapy:  Nurse Education  Participation Level:  Active  Participation Quality:  Appropriate  Affect:  Appropriate  Cognitive:  Alert  Insight:  Appropriate  Engagement in Group:  Engaged  Modes of Intervention:  Education  Summary of Progress/Problems: discussed effective, healthy communication skills.  Almira Barenny G Hayden Mabin 04/14/2016, 3:59 PM

## 2016-04-14 NOTE — Progress Notes (Signed)
Pt discharged from unit after AVS, transition report, prescriptions, and suicide risk assessment were reviewed. Pt confirmed information with teach back. Pt denies SI/HI/AVH and contracts to seek support if thoughts as such occur. Patient was offered support and encouragement. All belongings returned to patient from locker #47. Pts questions were answered and was escorted safely to transportation (mom).

## 2017-10-04 ENCOUNTER — Ambulatory Visit (HOSPITAL_COMMUNITY): Payer: Self-pay | Admitting: Psychiatry

## 2017-11-03 ENCOUNTER — Ambulatory Visit (INDEPENDENT_AMBULATORY_CARE_PROVIDER_SITE_OTHER): Payer: PRIVATE HEALTH INSURANCE | Admitting: Psychiatry

## 2017-11-03 ENCOUNTER — Encounter (HOSPITAL_COMMUNITY): Payer: Self-pay | Admitting: Psychiatry

## 2017-11-03 ENCOUNTER — Other Ambulatory Visit (HOSPITAL_COMMUNITY): Payer: Self-pay | Admitting: Psychiatry

## 2017-11-03 ENCOUNTER — Ambulatory Visit (INDEPENDENT_AMBULATORY_CARE_PROVIDER_SITE_OTHER): Payer: PRIVATE HEALTH INSURANCE

## 2017-11-03 VITALS — BP 112/64 | HR 49 | Ht 62.0 in | Wt 122.4 lb

## 2017-11-03 DIAGNOSIS — F319 Bipolar disorder, unspecified: Secondary | ICD-10-CM | POA: Diagnosis not present

## 2017-11-03 MED ORDER — ARIPIPRAZOLE 10 MG PO TABS: 10.0000 mg | ORAL_TABLET | Freq: Every day | ORAL | 0 refills | Status: DC

## 2017-11-03 MED ORDER — ESCITALOPRAM OXALATE 10 MG PO TABS
10.0000 mg | ORAL_TABLET | Freq: Every day | ORAL | 0 refills | Status: DC
Start: 2017-11-03 — End: 2018-02-25

## 2017-11-03 MED ORDER — ARIPIPRAZOLE ER 400 MG IM PRSY
400.0000 mg | PREFILLED_SYRINGE | INTRAMUSCULAR | Status: AC
  Administered 2017-11-03 – 2018-06-22 (×5): 400 mg via INTRAMUSCULAR

## 2017-11-03 MED ORDER — HYDROXYZINE HCL 10 MG PO TABS: 10.0000 mg | ORAL_TABLET | Freq: Every day | ORAL | 0 refills | Status: DC | PRN

## 2017-11-03 MED ORDER — ARIPIPRAZOLE ER 400 MG IM PRSY: 400.0000 mg | PREFILLED_SYRINGE | INTRAMUSCULAR | 2 refills | Status: DC
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
## 2017-11-03 NOTE — Progress Notes (Signed)
Patient presented today with an appropriate affect and good mood for her appointment with Dr. Lolly MustacheArfeen. Patient has been on Abilify tablets and will get the injection today. Patient had no questions, she had her mother with her for moral support, mom is a Engineer, civil (consulting)nurse. The injection of Abilify Maintena 400 mg IM was prepared as ordered and administered in patients upper right gluteal quadrant. Patient tolerated well and without complaint, she will return in 28 days for her next injection and will call with any questions.

## 2017-11-25 ENCOUNTER — Other Ambulatory Visit (HOSPITAL_COMMUNITY): Payer: Self-pay | Admitting: Psychiatry

## 2017-11-25 DIAGNOSIS — F319 Bipolar disorder, unspecified: Secondary | ICD-10-CM

## 2017-11-29 ENCOUNTER — Other Ambulatory Visit (HOSPITAL_COMMUNITY): Payer: Self-pay | Admitting: Psychiatry

## 2017-11-29 DIAGNOSIS — F319 Bipolar disorder, unspecified: Secondary | ICD-10-CM

## 2017-12-01 ENCOUNTER — Ambulatory Visit (INDEPENDENT_AMBULATORY_CARE_PROVIDER_SITE_OTHER): Payer: PRIVATE HEALTH INSURANCE

## 2017-12-01 DIAGNOSIS — F319 Bipolar disorder, unspecified: Secondary | ICD-10-CM

## 2017-12-01 NOTE — Progress Notes (Signed)
Patient presented today with an appropriate affect and good mood. Patient will get the injection today. Patient had no questions, she had her mother with her for moral support, mom is a Engineer, civil (consulting)nurse. The injection of Abilify Maintena 400 mg IM was prepared as ordered and administered in patients upper left gluteal quadrant. Patient tolerated well and without complaint, she will return in 28 days for her next injection and will call with any questions.

## 2017-12-03 ENCOUNTER — Other Ambulatory Visit (HOSPITAL_COMMUNITY): Payer: Self-pay

## 2017-12-03 DIAGNOSIS — F319 Bipolar disorder, unspecified: Secondary | ICD-10-CM

## 2017-12-03 MED ORDER — HYDROXYZINE HCL 10 MG PO TABS: 10.0000 mg | ORAL_TABLET | Freq: Every day | ORAL | 0 refills | Status: DC | PRN

## 2017-12-06 ENCOUNTER — Ambulatory Visit (HOSPITAL_COMMUNITY): Payer: Self-pay | Admitting: Psychiatry

## 2017-12-23 ENCOUNTER — Ambulatory Visit (HOSPITAL_COMMUNITY): Payer: Self-pay | Admitting: Psychiatry

## 2017-12-31 ENCOUNTER — Ambulatory Visit (HOSPITAL_COMMUNITY): Payer: PRIVATE HEALTH INSURANCE

## 2018-01-01 ENCOUNTER — Ambulatory Visit (INDEPENDENT_AMBULATORY_CARE_PROVIDER_SITE_OTHER): Payer: PRIVATE HEALTH INSURANCE

## 2018-01-01 DIAGNOSIS — F319 Bipolar disorder, unspecified: Secondary | ICD-10-CM

## 2018-01-01 NOTE — Progress Notes (Signed)
Patient presented today with appropriate affect and good mood for her monthly injection of Abilify Maintena 400 mg. Patient had no questions and said she is not having any suicidal thoughts or hallucinations. The Injection of Abilify Maintena 400 mg was prepared as ordered and administered in patients right upper outer gluteal quadrant. Patient tolerated well and without complaint. She will come back in a month and call with any questions.

## 2018-01-07 ENCOUNTER — Ambulatory Visit (HOSPITAL_COMMUNITY): Payer: Self-pay | Admitting: Psychiatry

## 2018-02-21 ENCOUNTER — Other Ambulatory Visit (HOSPITAL_COMMUNITY): Payer: Self-pay | Admitting: Psychiatry

## 2018-02-21 DIAGNOSIS — F319 Bipolar disorder, unspecified: Secondary | ICD-10-CM

## 2018-02-25 ENCOUNTER — Other Ambulatory Visit (HOSPITAL_COMMUNITY): Payer: Self-pay

## 2018-02-25 DIAGNOSIS — F319 Bipolar disorder, unspecified: Secondary | ICD-10-CM

## 2018-02-25 MED ORDER — ESCITALOPRAM OXALATE 10 MG PO TABS: 10.0000 mg | ORAL_TABLET | Freq: Every day | ORAL | 0 refills | Status: DC

## 2018-03-04 ENCOUNTER — Other Ambulatory Visit (HOSPITAL_COMMUNITY): Payer: Self-pay | Admitting: Psychiatry

## 2018-03-04 DIAGNOSIS — F319 Bipolar disorder, unspecified: Secondary | ICD-10-CM

## 2018-03-04 MED ORDER — ARIPIPRAZOLE ER 400 MG IM PRSY: 400.0000 mg | PREFILLED_SYRINGE | INTRAMUSCULAR | 2 refills | Status: DC

## 2018-03-04 MED ORDER — ESCITALOPRAM OXALATE 10 MG PO TABS: 10.0000 mg | ORAL_TABLET | Freq: Every day | ORAL | 0 refills | Status: DC

## 2018-03-04 NOTE — Progress Notes (Signed)
Prescription of Abilify and Lexapro called in.

## 2018-04-03 ENCOUNTER — Ambulatory Visit (HOSPITAL_COMMUNITY): Payer: PRIVATE HEALTH INSURANCE | Admitting: Psychiatry

## 2018-04-08 ENCOUNTER — Ambulatory Visit (INDEPENDENT_AMBULATORY_CARE_PROVIDER_SITE_OTHER): Payer: BLUE CROSS/BLUE SHIELD

## 2018-04-08 DIAGNOSIS — F332 Major depressive disorder, recurrent severe without psychotic features: Secondary | ICD-10-CM | POA: Diagnosis not present

## 2018-04-08 NOTE — Progress Notes (Signed)
Patient presented today with appropriate affect and good mood for her monthly injection of Abilify Maintena 400 mg. Patient had no questions and said she is not having any suicidal thoughts or hallucinations. Patient was previously getting the injection from her mother who is a Engineer, civil (consulting)nurse. I asked patient to please come here instead so there would be a record of her getting the shot. The Injection of Abilify Maintena 400 mg was prepared as ordered and administered in patients left upper outer gluteal quadrant. Patient tolerated well and without complaint. She will come back in a month and call with any questions.

## 2018-04-18 ENCOUNTER — Ambulatory Visit (HOSPITAL_COMMUNITY): Payer: PRIVATE HEALTH INSURANCE | Admitting: Psychiatry

## 2018-06-12 ENCOUNTER — Ambulatory Visit (HOSPITAL_COMMUNITY): Payer: PRIVATE HEALTH INSURANCE | Admitting: Psychiatry

## 2018-06-22 ENCOUNTER — Ambulatory Visit (INDEPENDENT_AMBULATORY_CARE_PROVIDER_SITE_OTHER): Payer: BLUE CROSS/BLUE SHIELD | Admitting: Psychiatry

## 2018-06-22 ENCOUNTER — Encounter (HOSPITAL_COMMUNITY): Payer: Self-pay

## 2018-06-22 ENCOUNTER — Ambulatory Visit (INDEPENDENT_AMBULATORY_CARE_PROVIDER_SITE_OTHER): Payer: BLUE CROSS/BLUE SHIELD

## 2018-06-22 ENCOUNTER — Other Ambulatory Visit (HOSPITAL_COMMUNITY): Payer: Self-pay | Admitting: Psychiatry

## 2018-06-22 VITALS — BP 98/53 | HR 61 | Ht 61.0 in | Wt 118.0 lb

## 2018-06-22 DIAGNOSIS — F319 Bipolar disorder, unspecified: Secondary | ICD-10-CM | POA: Diagnosis not present

## 2018-06-22 DIAGNOSIS — F419 Anxiety disorder, unspecified: Secondary | ICD-10-CM

## 2018-06-22 DIAGNOSIS — F332 Major depressive disorder, recurrent severe without psychotic features: Secondary | ICD-10-CM

## 2018-06-22 MED ORDER — ARIPIPRAZOLE ER 400 MG IM PRSY: 400.0000 mg | PREFILLED_SYRINGE | INTRAMUSCULAR | 1 refills | Status: DC

## 2018-06-22 NOTE — Progress Notes (Signed)
Patient presented today with appropriate affect and good mood for her monthly injection of Abilify Maintena 400 mg. Patient had no questions and said she is not having any suicidal thoughts or hallucinations. Patient last got her shot in November and today she had to see Dr. Lolly Mustache to get the injection. I asked patient to go ahead and schedule her next injection, that way she gets a reminder call and she is on my schedule so that if she misses the appointment I can call her. The Injection of Abilify Maintena 400 mg was prepared as ordered and administered in patients left upper outer gluteal quadrant. Patient tolerated well and without complaint. She will come back in a month and call with any questions.

## 2018-06-22 NOTE — Progress Notes (Signed)
BH MD/PA/NP OP Progress Note  06/22/2018 1:48 PM Colleen Morrison  MRN:  [id number]  Chief Complaint: I am sad and depressed.  I lost my job today.  HPI: Colleen Morrison is a 23 year old female who was seen last in June 2019.  Patient had missed appointment but apparently she has been getting injection Abilify monthly.  There is no record since November that she has given injection but she insists that Abilify injection is given.  Later she told that some time her mother who is a Engineer, civil (consulting)nurse also gives injection.  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  She was working for past 2 months but she was told that she was not doing her job and today she lost the job.  She admitted sadness and disappointment but denies any suicidal thoughts.  She was given Lexapro and hydroxyzine but she is not taking these medication.  She admitted history of noncompliance and she was skipping the Lexapro and then her mother told not to take it because she is not consistent.  She has been sleeping okay.  She denies any paranoia or any hallucination.  She lives by herself but she is very close to her mother and some time she had a lot of argument with the mother.  She had a history of anger and severe mood swings and risky behavior but she admitted since taking the Abilify these are less intense and less frequent.  She had a history of cutting herself however she has not done in a while.  She is getting Vyvanse 70 mg from her primary care physician for ADD.  Patient told she has been taking since eighth grade and it is helping her mood, focus, attention and multitasking.  She feels proud that she stopped cannabis use.  She feels her medicine working and she is interested to continue Abilify injection only.  She has no tremors, shakes or any EPS.  She is not seeing any therapist.  Visit Diagnosis:    ICD-10-CM   1. Anxiety F41.9 ARIPiprazole ER (ABILIFY MAINTENA) 400 MG PRSY prefilled syringe  2. Bipolar I  disorder (HCC) F31.9 ARIPiprazole ER (ABILIFY MAINTENA) 400 MG PRSY prefilled syringe    DISCONTINUED: ARIPiprazole ER (ABILIFY MAINTENA) 400 MG PRSY prefilled syringe    Past Psychiatric History: Reviewed. H/O ADD and bipolar disorder.  H/O inpatient twice due to suicidal thoughts.  H/O mania, anger, highs and lows and risky behavior.  Tried Remeron caused nightmares, Wellbutrin caused irritability.  Given Lexapro and hydroxyzine but stopped taking it.  Past Medical History:  Past Medical History:  Diagnosis Date  . Kidney stones   . PCOS (polycystic ovarian syndrome)   . Scoliosis   . Urinary tract infection     Past Surgical History:  Procedure Laterality Date  . GASTRIC BYPASS    . KIDNEY STONE SURGERY    . TONSILLECTOMY      Family Psychiatric History: Reviewed.  Family History:  Family History  Problem Relation Age of Onset  . Hyperlipidemia Mother   . Diabetes Other   . Hyperlipidemia Other   . Stroke Other   . Heart failure Other   . Bipolar disorder Brother     Social History:  Social History   Socioeconomic History  . Marital status: Single    Spouse name: Not on file  . Number of children: 0  . Years of education: Not on file  . Highest education level: Not on file  Occupational History  . Not on file  Social Needs  . Financial resource strain: Not on file  . Food insecurity:    Worry: Not on file    Inability: Not on file  . Transportation needs:    Medical: Not on file    Non-medical: Not on file  Tobacco Use  . Smoking status: Former Games developer  . Smokeless tobacco: Never Used  Substance and Sexual Activity  . Alcohol use: Yes    Comment: occ  . Drug use: Yes    Types: Marijuana    Comment: last used x 3 wks ago  . Sexual activity: Yes    Birth control/protection: I.U.D.  Lifestyle  . Physical activity:    Days per week: Not on file    Minutes per session: Not on file  . Stress: Not on file  Relationships  . Social connections:     Talks on phone: Not on file    Gets together: Not on file    Attends religious service: Not on file    Active member of club or organization: Not on file    Attends meetings of clubs or organizations: Not on file    Relationship status: Not on file  Other Topics Concern  . Not on file  Social History Narrative  . Not on file    Allergies:  Allergies  Allergen Reactions  . Phentermine-Topiramate Swelling  . Uncaria Tomentosa (Cats Claw) Swelling    CATS  . Other     mqysma -swelling    Metabolic Disorder Labs: No results found for: HGBA1C, MPG No results found for: PROLACTIN No results found for: CHOL, TRIG, HDL, CHOLHDL, VLDL, LDLCALC No results found for: TSH  Therapeutic Level Labs: No results found for: LITHIUM No results found for: VALPROATE No components found for:  CBMZ  Current Medications: Current Outpatient Medications  Medication Sig Dispense Refill  . Cholecalciferol (VITAMIN D3) 50 MCG (2000 UT) capsule take one capsule by mouth daily    . cyanocobalamin (,VITAMIN B-12,) 1000 MCG/ML injection Inject into the muscle.    . ARIPiprazole ER (ABILIFY MAINTENA) 400 MG PRSY prefilled syringe Inject 400 mg into the muscle every 28 (twenty-eight) days. 1 each 1  . Cannabidiol 100 MG/ML SOLN Take by mouth.    Marland Kitchen VYVANSE 70 MG capsule      Current Facility-Administered Medications  Medication Dose Route Frequency Provider Last Rate Last Dose  . ARIPiprazole ER (ABILIFY MAINTENA) 400 MG prefilled syringe 400 mg  400 mg Intramuscular Q28 days Peri Kreft, Phillips Grout, MD   400 mg at 04/08/18 0930     Musculoskeletal: Strength & Muscle Tone: within normal limits Gait & Station: normal Patient leans: N/A  Psychiatric Specialty Exam: Review of Systems  Constitutional: Negative.   Skin: Negative.   Psychiatric/Behavioral: The patient is nervous/anxious.     Blood pressure (!) 98/53, pulse 61, height 5\' 1"  (1.549 m), weight 118 lb (53.5 kg).There is no height or weight on  file to calculate BMI.  General Appearance: Casual and tearful and emotional  Eye Contact:  Fair  Speech:  Slow  Volume:  Decreased  Mood:  Dysphoric  Affect:  Constricted  Thought Process:  Goal Directed  Orientation:  Full (Time, Place, and Person)  Thought Content: Rumination   Suicidal Thoughts:  No  Homicidal Thoughts:  No  Memory:  Immediate;   Good Recent;   Good Remote;   Good  Judgement:  Fair  Insight:  Fair  Psychomotor Activity:  Decreased  Concentration:  Concentration: Fair and Attention Span: Fair  Recall:  [employer] of Knowledge: Good  Language: Good  Akathisia:  No  Handed:  Right  AIMS (if indicated): not done  Assets:  Communication Skills Desire for Improvement Housing Social Support  ADL's:  Intact  Cognition: WNL  Sleep:  Fair   Screenings: AIMS     [facility]  AIMS Total Score  0    AUDIT     [facility]  Alcohol Use Disorder Identification Test Final Score (AUDIT)  0       Assessment and Plan: Bipolar disorder type I.  ADD by history.  I reviewed her records, current medication and psychosocial stressors.  Patient has significant history of noncompliant with medication.  Though she like Abilify and wants to continue injection once a month but she is not interested in therapy or any other oral medication.  We talked about Vyvanse abuse, dependence, benefits and withdrawal symptoms.  She like to continue Vyvanse which helps her focus even though we discussed it may cause worsening of mania and anxiety.  She is getting Vyvanse 70 mg from her primary care physician.  She is not interested in therapy.  I will discontinue Lexapro and hydroxyzine which she has been noncompliant with medication.  We also discussed that injection should be given by our staff rather than any family member and she agreed  that in the future she will come to get the injection.  She will get Abilify injection 400 mg intramuscular today.  She did not recall any side effects.  Recommended to call us back if she is any question or any concern.  Discussed safety concerns at any time having active suicidal thoughts or homicidal thought that she need to call 911 or go to local emergency room.  Follow-up in 2 months.  Time spent 30 minutes.   Cleotis Nipper, MD 06/22/2018, 1:48 PM

## 2018-07-16 ENCOUNTER — Ambulatory Visit (HOSPITAL_COMMUNITY): Payer: PRIVATE HEALTH INSURANCE | Admitting: Psychiatry

## 2018-07-21 ENCOUNTER — Ambulatory Visit (HOSPITAL_COMMUNITY): Payer: PRIVATE HEALTH INSURANCE

## 2018-07-27 ENCOUNTER — Ambulatory Visit (HOSPITAL_COMMUNITY): Payer: PRIVATE HEALTH INSURANCE

## 2018-08-21 ENCOUNTER — Ambulatory Visit (HOSPITAL_COMMUNITY): Payer: PRIVATE HEALTH INSURANCE | Admitting: Psychiatry

## 2018-11-02 ENCOUNTER — Other Ambulatory Visit: Payer: Self-pay

## 2018-11-02 ENCOUNTER — Encounter (HOSPITAL_COMMUNITY): Payer: Self-pay | Admitting: Emergency Medicine

## 2018-11-02 ENCOUNTER — Emergency Department (HOSPITAL_COMMUNITY)
Admission: EM | Admit: 2018-11-02 | Discharge: 2018-11-02 | Disposition: A | Discharge status: Home / Self Care | Payer: BC Managed Care – PPO | Attending: Emergency Medicine | Admitting: Emergency Medicine

## 2018-11-02 DIAGNOSIS — Z87891 Personal history of nicotine dependence: Secondary | ICD-10-CM | POA: Insufficient documentation

## 2018-11-02 DIAGNOSIS — Z79899 Other long term (current) drug therapy: Secondary | ICD-10-CM | POA: Diagnosis not present

## 2018-11-02 DIAGNOSIS — R2241 Localized swelling, mass and lump, right lower limb: Secondary | ICD-10-CM | POA: Diagnosis present

## 2018-11-02 DIAGNOSIS — L02415 Cutaneous abscess of right lower limb: Secondary | ICD-10-CM | POA: Diagnosis not present

## 2018-11-02 DIAGNOSIS — L0291 Cutaneous abscess, unspecified: Secondary | ICD-10-CM

## 2018-11-02 MED ORDER — OXYCODONE-ACETAMINOPHEN 5-325 MG PO TABS: 1.0000 | ORAL_TABLET | Freq: Four times a day (QID) | ORAL | 0 refills | Status: DC | PRN

## 2018-11-02 MED ORDER — SULFAMETHOXAZOLE-TRIMETHOPRIM 800-160 MG PO TABS: 1.0000 | ORAL_TABLET | Freq: Two times a day (BID) | ORAL | 0 refills | Status: AC

## 2018-11-02 MED ORDER — LIDOCAINE-EPINEPHRINE (PF) 2 %-1:200000 IJ SOLN
INTRAMUSCULAR | Status: AC
  Filled 2018-11-02: qty 20

## 2018-11-02 MED ORDER — LIDOCAINE-EPINEPHRINE (PF) 2 %-1:200000 IJ SOLN
20.0000 mL | Freq: Once | INTRAMUSCULAR | Status: AC
  Administered 2018-11-02: 20 mL

## 2018-11-02 MED ORDER — OXYCODONE-ACETAMINOPHEN 5-325 MG PO TABS
1.0000 | ORAL_TABLET | Freq: Once | ORAL | Status: AC
  Administered 2018-11-02: 1 via ORAL
  Filled 2018-11-02: qty 1

## 2018-11-02 NOTE — Discharge Instructions (Addendum)
Please take bandaging off and shower twice daily letting warm water run over the wound.  Please bandage the wound after each bathing.  If you develop any new or worsening signs or symptoms return immediately to the emergency room.  If your symptoms persist beyond 48 hours please return for repeat evaluation.

## 2018-11-02 NOTE — ED Triage Notes (Signed)
Pt. Stated, I got bit by a spider a week ago on the upper right thigh

## 2018-11-02 NOTE — ED Provider Notes (Signed)
[facility] Provider Note   CSN: [id number] Arrival date & time: 11/02/18  1041       History   Chief Complaint Chief Complaint  Patient presents with  . Insect Bite  . Wound Infection    HPI [facility] is a 23 y.o. female.     HPI   23 year old female presents today with complaints of abscess.  Patient notes approximately 8 days ago she developed swelling to her right lateral thigh.  She notes pain and redness.  She denies any discharge or fever.  She notes [facility], she was placed on Bactrim but did not have an I&D at that time.  She was again seen at an urgent care this morning and told she needed to come to the emergency room for evaluation.  History of diabetes or significant skin infections  Past Medical History:  Diagnosis Date  . Kidney stones   . PCOS (polycystic ovarian syndrome)   . Scoliosis   . Urinary tract infection     Patient Active Problem List   Diagnosis Date Noted  . GAD (generalized anxiety disorder)   . Parent relationship problem   . Severe episode of recurrent major depressive disorder, without psychotic features ([place]) 04/12/2016  . MDD (major depressive disorder), recurrent episode, severe ([place]) 04/12/2016  . Attention deficit hyperactivity disorder 08/29/2014    Past Surgical History:  Procedure Laterality Date  . GASTRIC BYPASS    . KIDNEY STONE SURGERY    . TONSILLECTOMY       OB History   No obstetric history on file.      Home Medications    Prior to Admission medications   Medication Sig Start Date End Date Taking? Authorizing Provider  ARIPiprazole ER (ABILIFY MAINTENA) 400 MG PRSY prefilled syringe Inject 400 mg into the muscle every 28 (twenty-eight) days. 06/22/18   Arfeen, Arlyce Harman, MD  Cannabidiol 100 MG/ML SOLN Take by mouth.    [provider]  Cholecalciferol (VITAMIN D3) 50 MCG (2000 UT) capsule take one capsule by mouth daily 07/27/15    [provider]  cyanocobalamin (,VITAMIN B-12,) 1000 MCG/ML injection Inject into the muscle. 12/30/17   [provider]  oxyCODONE-acetaminophen (PERCOCET/ROXICET) 5-325 MG tablet Take 1 tablet by mouth every 6 (six) hours as needed. 11/02/18   Aileene Lanum, Dellis Filbert, PA-C  sulfamethoxazole-trimethoprim (BACTRIM DS) 800-160 MG tablet Take 1 tablet by mouth 2 (two) times daily for 3 days. 11/02/18 11/05/18  Kortney Potvin, Dellis Filbert, PA-C  VYVANSE 70 MG capsule  06/02/18   [provider]    Family History Family History  Problem Relation Age of Onset  . Hyperlipidemia Mother   . Diabetes Other   . Hyperlipidemia Other   . Stroke Other   . Heart failure Other   . Bipolar disorder Brother     Social History Social History   Tobacco Use  . Smoking status: Former Research scientist (life sciences)  . Smokeless tobacco: Never Used  Substance Use Topics  . Alcohol use: Yes    Comment: occ  . Drug use: Yes    Types: Marijuana    Comment: last used x 3 wks ago     Allergies   Phentermine-topiramate, Uncaria tomentosa (cats claw), and Other   Review of Systems Review of Systems  All other systems reviewed and are negative.  Physical Exam Updated Vital Signs BP (!) 94/59 (BP Location: Left Arm)   Pulse (!) 54   Temp  97.6 F (36.4 C)   Resp 16   SpO2 99%   Physical Exam Vitals signs and nursing note reviewed.  Constitutional:      Appearance: She is well-developed.  HENT:     Head: Normocephalic and atraumatic.  Eyes:     General: No scleral icterus.       Right eye: No discharge.        Left eye: No discharge.     Conjunctiva/sclera: Conjunctivae normal.     Pupils: Pupils are equal, round, and reactive to light.  Neck:     Musculoskeletal: Normal range of motion.     Vascular: No JVD.     Trachea: No tracheal deviation.  Pulmonary:     Effort: Pulmonary effort is normal.     Breath sounds: No stridor.  Musculoskeletal:     Comments: 5 cm area of redness and induration to the  right lateral thigh with central fluctuance, compartments are soft, no significant pain with flexion extension at the knee  Neurological:     Mental Status: She is alert and oriented to person, place, and time.     Coordination: Coordination normal.  Psychiatric:        Behavior: Behavior normal.        Thought Content: Thought content normal.        Judgment: Judgment normal.      ED Treatments / Results  Labs (all labs ordered are listed, but only abnormal results are displayed) Labs Reviewed - No data to display  EKG [facility]  Radiology No results found.  Procedures .Marland Kitchen.Incision and Drainage  Date/Time: 11/02/2018 8:13 PM Performed by: Eyvonne MechanicHedges, Cassity Christian, PA-C Authorized by: Eyvonne MechanicHedges, Makella Buckingham, PA-C   Consent:    Consent obtained:  Verbal   Consent given by:  Patient   Risks discussed:  Bleeding, incomplete drainage and infection   Alternatives discussed:  No treatment and alternative treatment Location:    Type:  Abscess   Size:  3   Location:  Lower extremity   Lower extremity location:  Leg   Leg location:  R upper leg Pre-procedure details:    Skin preparation:  Chloraprep Anesthesia (see MAR for exact dosages):    Anesthesia method:  Local infiltration   Local anesthetic:  Lidocaine 2% WITH epi Procedure type:    Complexity:  Simple Procedure details:    Needle aspiration: no     Incision types:  Single straight   Incision depth:  Dermal   Scalpel blade:  11   Wound management:  Probed and deloculated and irrigated with saline   Drainage:  Purulent   Drainage amount:  Copious   Wound treatment:  Wound left open   Packing materials:  [facility] Post-procedure details:    Patient tolerance of procedure:  Tolerated well, no immediate complications   (including critical care time)  Medications Ordered in ED Medications  oxyCODONE-acetaminophen (PERCOCET/ROXICET) 5-325 MG per tablet 1 tablet (1 tablet Oral Given 11/02/18 1223)  lidocaine-EPINEPHrine (XYLOCAINE W/EPI)  2 %-1:200000 (PF) injection 20 mL (20 mLs Infiltration Given 11/02/18 1224)     Initial Impression / Assessment and Plan / ED Course  I have reviewed the triage vital signs and the nursing notes.  Pertinent labs & imaging results that were available during my care of the patient were reviewed by me and considered in my medical decision making (see chart for details).          Assessment/Plan: 23 year old female presents today with abscess.  I&D successful without  complications.  Patient will be continued on Bactrim with an extended 3-day dose.  Patient will return immediately if she develops any new or worsening signs or symptoms.  She verbalized understanding and agreement to today's plan had no further questions or concerns at time of discharge.   Final Clinical Impressions(s) / ED Diagnoses   Final diagnoses:  Abscess    ED Discharge Orders         Ordered    sulfamethoxazole-trimethoprim (BACTRIM DS) 800-160 MG tablet  2 times daily     11/02/18 1512    oxyCODONE-acetaminophen (PERCOCET/ROXICET) 5-325 MG tablet  Every 6 hours PRN     11/02/18 1513           Rosalio LoudHedges, Abhay Godbolt, PA-C 11/02/18 2016    Wynetta FinesMessick, Peter C, MD 11/16/18 [phone]

## 2018-11-02 NOTE — ED Notes (Signed)
Patient verbalizes understanding of discharge instructions. Opportunity for questioning and answers were provided. Armband removed by staff, pt discharged from ED home via POV.  

## 2019-11-01 ENCOUNTER — Inpatient Hospital Stay (HOSPITAL_COMMUNITY): Payer: 59 | Admitting: Certified Registered"

## 2019-11-01 ENCOUNTER — Encounter (HOSPITAL_COMMUNITY): Payer: Self-pay

## 2019-11-01 ENCOUNTER — Inpatient Hospital Stay (HOSPITAL_COMMUNITY): Payer: 59

## 2019-11-01 ENCOUNTER — Other Ambulatory Visit: Payer: Self-pay

## 2019-11-01 ENCOUNTER — Emergency Department (HOSPITAL_COMMUNITY): Payer: 59

## 2019-11-01 ENCOUNTER — Encounter (HOSPITAL_COMMUNITY)
Admission: EM | Disposition: A | Discharge status: Home / Self Care | Payer: Self-pay | Source: Home / Self Care | Attending: Student

## 2019-11-01 ENCOUNTER — Inpatient Hospital Stay (HOSPITAL_COMMUNITY)
Admission: EM | Admit: 2019-11-01 | Discharge: 2019-11-05 | DRG: 494 | Disposition: A | Discharge status: Home / Self Care | Payer: 59 | Attending: Student | Admitting: Student

## 2019-11-01 DIAGNOSIS — S82409A Unspecified fracture of shaft of unspecified fibula, initial encounter for closed fracture: Secondary | ICD-10-CM | POA: Diagnosis present

## 2019-11-01 DIAGNOSIS — Z83438 Family history of other disorder of lipoprotein metabolism and other lipidemia: Secondary | ICD-10-CM | POA: Diagnosis not present

## 2019-11-01 DIAGNOSIS — E282 Polycystic ovarian syndrome: Secondary | ICD-10-CM | POA: Diagnosis present

## 2019-11-01 DIAGNOSIS — Z9884 Bariatric surgery status: Secondary | ICD-10-CM

## 2019-11-01 DIAGNOSIS — S82871A Displaced pilon fracture of right tibia, initial encounter for closed fracture: Principal | ICD-10-CM | POA: Diagnosis present

## 2019-11-01 DIAGNOSIS — Y92410 Unspecified street and highway as the place of occurrence of the external cause: Secondary | ICD-10-CM | POA: Diagnosis not present

## 2019-11-01 DIAGNOSIS — Z833 Family history of diabetes mellitus: Secondary | ICD-10-CM

## 2019-11-01 DIAGNOSIS — S82309A Unspecified fracture of lower end of unspecified tibia, initial encounter for closed fracture: Secondary | ICD-10-CM | POA: Diagnosis present

## 2019-11-01 DIAGNOSIS — Z419 Encounter for procedure for purposes other than remedying health state, unspecified: Secondary | ICD-10-CM

## 2019-11-01 DIAGNOSIS — Z823 Family history of stroke: Secondary | ICD-10-CM | POA: Diagnosis not present

## 2019-11-01 DIAGNOSIS — R7989 Other specified abnormal findings of blood chemistry: Secondary | ICD-10-CM

## 2019-11-01 DIAGNOSIS — Z8249 Family history of ischemic heart disease and other diseases of the circulatory system: Secondary | ICD-10-CM

## 2019-11-01 DIAGNOSIS — F909 Attention-deficit hyperactivity disorder, unspecified type: Secondary | ICD-10-CM | POA: Diagnosis present

## 2019-11-01 DIAGNOSIS — M419 Scoliosis, unspecified: Secondary | ICD-10-CM | POA: Diagnosis present

## 2019-11-01 DIAGNOSIS — Z888 Allergy status to other drugs, medicaments and biological substances status: Secondary | ICD-10-CM

## 2019-11-01 DIAGNOSIS — Z87891 Personal history of nicotine dependence: Secondary | ICD-10-CM | POA: Diagnosis not present

## 2019-11-01 DIAGNOSIS — R7401 Elevation of levels of liver transaminase levels: Secondary | ICD-10-CM

## 2019-11-01 DIAGNOSIS — Z20822 Contact with and (suspected) exposure to covid-19: Secondary | ICD-10-CM | POA: Diagnosis present

## 2019-11-01 DIAGNOSIS — F418 Other specified anxiety disorders: Secondary | ICD-10-CM | POA: Diagnosis present

## 2019-11-01 DIAGNOSIS — T148XXA Other injury of unspecified body region, initial encounter: Secondary | ICD-10-CM

## 2019-11-01 DIAGNOSIS — F10920 Alcohol use, unspecified with intoxication, uncomplicated: Secondary | ICD-10-CM

## 2019-11-01 DIAGNOSIS — S82301A Unspecified fracture of lower end of right tibia, initial encounter for closed fracture: Secondary | ICD-10-CM

## 2019-11-01 HISTORY — DX: Depression, unspecified: F32.A

## 2019-11-01 HISTORY — PX: EXTERNAL FIXATION LEG: SHX1549

## 2019-11-01 HISTORY — DX: Personal history of urinary calculi: Z87.442

## 2019-11-01 HISTORY — PX: EXTERNAL FIXATOR APPLICATION: SHX1554

## 2019-11-01 HISTORY — DX: Bipolar disorder, unspecified: F31.9

## 2019-11-01 LAB — SARS CORONAVIRUS 2 BY RT PCR (HOSPITAL ORDER, PERFORMED IN ~~LOC~~ HOSPITAL LAB): SARS Coronavirus 2: NEGATIVE

## 2019-11-01 LAB — COMPREHENSIVE METABOLIC PANEL
ALT: 106 U/L — ABNORMAL HIGH (ref 0–44)
AST: 380 U/L — ABNORMAL HIGH (ref 15–41)
Albumin: 3.6 g/dL (ref 3.5–5.0)
Alkaline Phosphatase: 49 U/L (ref 38–126)
Anion gap: 11 (ref 5–15)
BUN: 5 mg/dL — ABNORMAL LOW (ref 6–20)
CO2: 22 mmol/L (ref 22–32)
Calcium: 8.6 mg/dL — ABNORMAL LOW (ref 8.9–10.3)
Chloride: 107 mmol/L (ref 98–111)
Creatinine, Ser: 0.9 mg/dL (ref 0.44–1.00)
GFR calc Af Amer: >60 mL/min (ref >60)
GFR calc non Af Amer: >60 mL/min (ref >60)
Glucose, Bld: 103 mg/dL — ABNORMAL HIGH (ref 70–99)
Potassium: 3.4 mmol/L — ABNORMAL LOW (ref 3.5–5.1)
Sodium: 140 mmol/L (ref 135–145)
Total Bilirubin: 0.6 mg/dL (ref 0.3–1.2)
Total Protein: 5.9 g/dL — ABNORMAL LOW (ref 6.5–8.1)

## 2019-11-01 LAB — URINALYSIS, COMPLETE (UACMP) WITH MICROSCOPIC
Bacteria, UA: NONE SEEN
Bilirubin Urine: NEGATIVE
Glucose, UA: NEGATIVE mg/dL
Ketones, ur: NEGATIVE mg/dL
Leukocytes,Ua: NEGATIVE
Nitrite: NEGATIVE
Protein, ur: NEGATIVE mg/dL
Specific Gravity, Urine: 1.02 (ref 1.005–1.030)
pH: 6 (ref 5.0–8.0)

## 2019-11-01 LAB — SURGICAL PCR SCREEN
MRSA, PCR: NEGATIVE
Staphylococcus aureus: NEGATIVE

## 2019-11-01 LAB — HEPATITIS PANEL, ACUTE
HCV Ab: NONREACTIVE
Hep A IgM: NONREACTIVE
Hep B C IgM: NONREACTIVE
Hepatitis B Surface Ag: NONREACTIVE

## 2019-11-01 LAB — SAMPLE TO BLOOD BANK

## 2019-11-01 LAB — PROTIME-INR
INR: 1 (ref 0.8–1.2)
Prothrombin Time: 13.2 seconds (ref 11.4–15.2)

## 2019-11-01 LAB — LACTIC ACID, PLASMA: Lactic Acid, Venous: 3.9 mmol/L (ref 0.5–1.9)

## 2019-11-01 LAB — I-STAT BETA HCG BLOOD, ED (MC, WL, AP ONLY): I-stat hCG, quantitative: <5 m[IU]/mL (ref <5)

## 2019-11-01 LAB — ETHANOL: Alcohol, Ethyl (B): 208 mg/dL — ABNORMAL HIGH (ref <10)

## 2019-11-01 SURGERY — EXTERNAL FIXATION, LOWER EXTREMITY
Anesthesia: General | Site: Leg Lower | Laterality: Right

## 2019-11-01 MED ORDER — ONDANSETRON HCL 4 MG/2ML IJ SOLN: 4.0000 mg | Freq: Four times a day (QID) | INTRAMUSCULAR | Status: DC | PRN

## 2019-11-01 MED ORDER — OXYCODONE HCL 5 MG PO TABS
ORAL_TABLET | ORAL | Status: AC
  Filled 2019-11-01: qty 1

## 2019-11-01 MED ORDER — ACETAMINOPHEN 10 MG/ML IV SOLN
1000.0000 mg | Freq: Once | INTRAVENOUS | Status: DC | PRN
  Administered 2019-11-01: 1000 mg via INTRAVENOUS

## 2019-11-01 MED ORDER — CHLORHEXIDINE GLUCONATE 0.12 % MT SOLN: 15.0000 mL | Freq: Once | OROMUCOSAL | Status: AC

## 2019-11-01 MED ORDER — MIDAZOLAM HCL 2 MG/2ML IJ SOLN
INTRAMUSCULAR | Status: AC
  Filled 2019-11-01: qty 2

## 2019-11-01 MED ORDER — ROCURONIUM BROMIDE 10 MG/ML (PF) SYRINGE
PREFILLED_SYRINGE | INTRAVENOUS | Status: AC
  Filled 2019-11-01: qty 10

## 2019-11-01 MED ORDER — LIDOCAINE 2% (20 MG/ML) 5 ML SYRINGE
INTRAMUSCULAR | Status: DC | PRN
  Administered 2019-11-01: 60 mg via INTRAVENOUS

## 2019-11-01 MED ORDER — IOHEXOL 300 MG/ML  SOLN
100.0000 mL | Freq: Once | INTRAMUSCULAR | Status: AC | PRN
  Administered 2019-11-01: 100 mL via INTRAVENOUS

## 2019-11-01 MED ORDER — DEXAMETHASONE SODIUM PHOSPHATE 10 MG/ML IJ SOLN
INTRAMUSCULAR | Status: AC
  Filled 2019-11-01: qty 1

## 2019-11-01 MED ORDER — OXYCODONE HCL 5 MG/5ML PO SOLN: 5.0000 mg | Freq: Once | ORAL | Status: AC | PRN

## 2019-11-01 MED ORDER — OXYCODONE HCL 5 MG PO TABS
10.0000 mg | ORAL_TABLET | ORAL | Status: DC | PRN
  Administered 2019-11-01 – 2019-11-05 (×10): 10 mg via ORAL
  Filled 2019-11-01 (×11): qty 2

## 2019-11-01 MED ORDER — SUGAMMADEX SODIUM 200 MG/2ML IV SOLN
INTRAVENOUS | Status: DC | PRN
  Administered 2019-11-01: 200 mg via INTRAVENOUS

## 2019-11-01 MED ORDER — VANCOMYCIN HCL 1000 MG IV SOLR
INTRAVENOUS | Status: AC
  Filled 2019-11-01: qty 1000

## 2019-11-01 MED ORDER — MORPHINE SULFATE (PF) 4 MG/ML IV SOLN
4.0000 mg | Freq: Once | INTRAVENOUS | Status: AC
  Administered 2019-11-01: 4 mg via INTRAVENOUS
  Filled 2019-11-01: qty 1

## 2019-11-01 MED ORDER — PROPOFOL 10 MG/ML IV BOLUS
INTRAVENOUS | Status: AC
  Filled 2019-11-01: qty 20

## 2019-11-01 MED ORDER — PROMETHAZINE HCL 25 MG/ML IJ SOLN: 6.2500 mg | INTRAMUSCULAR | Status: DC | PRN

## 2019-11-01 MED ORDER — PROPOFOL 10 MG/ML IV BOLUS
INTRAVENOUS | Status: DC | PRN
  Administered 2019-11-01: 150 mg via INTRAVENOUS

## 2019-11-01 MED ORDER — FENTANYL CITRATE (PF) 100 MCG/2ML IJ SOLN
INTRAMUSCULAR | Status: AC
  Administered 2019-11-01: 100 ug via INTRAVENOUS
  Filled 2019-11-01: qty 2

## 2019-11-01 MED ORDER — HYDROMORPHONE HCL 1 MG/ML IJ SOLN
1.0000 mg | INTRAMUSCULAR | Status: DC | PRN
  Administered 2019-11-02 – 2019-11-05 (×14): 1 mg via INTRAVENOUS
  Filled 2019-11-01 (×15): qty 1

## 2019-11-01 MED ORDER — ENOXAPARIN SODIUM 40 MG/0.4ML ~~LOC~~ SOLN
40.0000 mg | SUBCUTANEOUS | Status: DC
  Administered 2019-11-02 – 2019-11-04 (×3): 40 mg via SUBCUTANEOUS
  Filled 2019-11-01 (×4): qty 0.4

## 2019-11-01 MED ORDER — DEXAMETHASONE SODIUM PHOSPHATE 10 MG/ML IJ SOLN
INTRAMUSCULAR | Status: DC | PRN
  Administered 2019-11-01: 8 mg via INTRAVENOUS

## 2019-11-01 MED ORDER — LACTATED RINGERS IV SOLN: INTRAVENOUS | Status: DC | PRN

## 2019-11-01 MED ORDER — HYDROMORPHONE HCL 1 MG/ML IJ SOLN
0.2500 mg | INTRAMUSCULAR | Status: DC | PRN
  Administered 2019-11-01 (×4): 0.25 mg via INTRAVENOUS

## 2019-11-01 MED ORDER — KETOROLAC TROMETHAMINE 30 MG/ML IJ SOLN
INTRAMUSCULAR | Status: AC
  Filled 2019-11-01: qty 1

## 2019-11-01 MED ORDER — CEFAZOLIN SODIUM-DEXTROSE 2-3 GM-%(50ML) IV SOLR
INTRAVENOUS | Status: DC | PRN
Start: 2019-11-01 — End: 2019-11-01
  Administered 2019-11-01: 2 g via INTRAVENOUS

## 2019-11-01 MED ORDER — ROCURONIUM BROMIDE 10 MG/ML (PF) SYRINGE
PREFILLED_SYRINGE | INTRAVENOUS | Status: DC | PRN
  Administered 2019-11-01: 40 mg via INTRAVENOUS

## 2019-11-01 MED ORDER — ORAL CARE MOUTH RINSE: 15.0000 mL | Freq: Once | OROMUCOSAL | Status: AC

## 2019-11-01 MED ORDER — HYDROMORPHONE HCL 1 MG/ML IJ SOLN
INTRAMUSCULAR | Status: AC
  Filled 2019-11-01: qty 1

## 2019-11-01 MED ORDER — SODIUM CHLORIDE 0.9 % IV BOLUS
1000.0000 mL | Freq: Once | INTRAVENOUS | Status: AC
  Administered 2019-11-01: 1000 mL via INTRAVENOUS

## 2019-11-01 MED ORDER — GABAPENTIN 100 MG PO CAPS
100.0000 mg | ORAL_CAPSULE | Freq: Three times a day (TID) | ORAL | Status: DC
  Administered 2019-11-01 – 2019-11-04 (×9): 100 mg via ORAL
  Filled 2019-11-01 (×9): qty 1

## 2019-11-01 MED ORDER — ONDANSETRON HCL 4 MG/2ML IJ SOLN
4.0000 mg | Freq: Once | INTRAMUSCULAR | Status: AC
  Administered 2019-11-01: 4 mg via INTRAVENOUS
  Filled 2019-11-01: qty 2

## 2019-11-01 MED ORDER — CHLORHEXIDINE GLUCONATE 0.12 % MT SOLN
OROMUCOSAL | Status: AC
  Administered 2019-11-01: 15 mL via OROMUCOSAL
  Filled 2019-11-01: qty 15

## 2019-11-01 MED ORDER — FENTANYL CITRATE (PF) 100 MCG/2ML IJ SOLN
INTRAMUSCULAR | Status: DC | PRN
  Administered 2019-11-01: 150 ug via INTRAVENOUS

## 2019-11-01 MED ORDER — FENTANYL CITRATE (PF) 100 MCG/2ML IJ SOLN: 100.0000 ug | Freq: Once | INTRAMUSCULAR | Status: AC

## 2019-11-01 MED ORDER — ACETAMINOPHEN 500 MG PO TABS
1000.0000 mg | ORAL_TABLET | Freq: Four times a day (QID) | ORAL | Status: DC
  Administered 2019-11-01 – 2019-11-02 (×3): 1000 mg via ORAL
  Filled 2019-11-01 (×3): qty 2

## 2019-11-01 MED ORDER — ONDANSETRON HCL 4 MG/2ML IJ SOLN
INTRAMUSCULAR | Status: AC
  Filled 2019-11-01: qty 2

## 2019-11-01 MED ORDER — DEXMEDETOMIDINE HCL 200 MCG/2ML IV SOLN
INTRAVENOUS | Status: DC | PRN
  Administered 2019-11-01: 20 ug via INTRAVENOUS
  Administered 2019-11-01 (×2): 12 ug via INTRAVENOUS

## 2019-11-01 MED ORDER — SODIUM CHLORIDE 0.9 % IV SOLN: Freq: Once | INTRAVENOUS | Status: AC

## 2019-11-01 MED ORDER — OXYCODONE HCL 5 MG PO TABS: 5.0000 mg | ORAL_TABLET | ORAL | Status: DC | PRN

## 2019-11-01 MED ORDER — OXYCODONE HCL 5 MG PO TABS
5.0000 mg | ORAL_TABLET | Freq: Once | ORAL | Status: AC | PRN
  Administered 2019-11-01: 5 mg via ORAL

## 2019-11-01 MED ORDER — PHENYLEPHRINE 40 MCG/ML (10ML) SYRINGE FOR IV PUSH (FOR BLOOD PRESSURE SUPPORT)
PREFILLED_SYRINGE | INTRAVENOUS | Status: AC
  Filled 2019-11-01: qty 10

## 2019-11-01 MED ORDER — 0.9 % SODIUM CHLORIDE (POUR BTL) OPTIME
TOPICAL | Status: DC | PRN
  Administered 2019-11-01: 1000 mL

## 2019-11-01 MED ORDER — ONDANSETRON HCL 4 MG/2ML IJ SOLN
INTRAMUSCULAR | Status: DC | PRN
  Administered 2019-11-01: 4 mg via INTRAVENOUS

## 2019-11-01 MED ORDER — MIDAZOLAM HCL 5 MG/5ML IJ SOLN
INTRAMUSCULAR | Status: DC | PRN
  Administered 2019-11-01: 2 mg via INTRAVENOUS

## 2019-11-01 MED ORDER — MORPHINE SULFATE (PF) 4 MG/ML IV SOLN
4.0000 mg | Freq: Once | INTRAVENOUS | Status: DC
  Filled 2019-11-01: qty 1

## 2019-11-01 MED ORDER — OXYCODONE HCL 5 MG PO TABS: 10.0000 mg | ORAL_TABLET | ORAL | Status: DC | PRN

## 2019-11-01 MED ORDER — LIDOCAINE 2% (20 MG/ML) 5 ML SYRINGE
INTRAMUSCULAR | Status: AC
  Filled 2019-11-01: qty 5

## 2019-11-01 MED ORDER — FENTANYL CITRATE (PF) 250 MCG/5ML IJ SOLN
INTRAMUSCULAR | Status: AC
  Filled 2019-11-01: qty 5

## 2019-11-01 MED ORDER — LACTATED RINGERS IV SOLN: INTRAVENOUS | Status: DC

## 2019-11-01 MED ORDER — ACETAMINOPHEN 10 MG/ML IV SOLN
INTRAVENOUS | Status: AC
  Filled 2019-11-01: qty 100

## 2019-11-01 MED ORDER — MORPHINE SULFATE (PF) 4 MG/ML IV SOLN
4.0000 mg | INTRAVENOUS | Status: DC | PRN
  Administered 2019-11-01 (×3): 4 mg via INTRAVENOUS
  Filled 2019-11-01 (×2): qty 1

## 2019-11-01 MED ORDER — CEFAZOLIN SODIUM 1 G IJ SOLR
INTRAMUSCULAR | Status: AC
  Filled 2019-11-01: qty 20

## 2019-11-01 SURGICAL SUPPLY — 50 items
BAR GLASS FIBER EXFX 11X150 (EXFIX) ×6 IMPLANT
BAR GLASS FIBER EXFX 11X400 (EXFIX) ×6 IMPLANT
BIT DRILL 3.2 XTRAFIX BLUE (BIT) ×3 IMPLANT
BIT DRILL CANN MED FLUTE 4.0 (BIT) ×1 IMPLANT
BNDG ELASTIC 4X5.8 VLCR STR LF (GAUZE/BANDAGES/DRESSINGS) ×3 IMPLANT
BNDG GAUZE ELAST 4 BULKY (GAUZE/BANDAGES/DRESSINGS) ×3 IMPLANT
BRUSH SCRUB EZ PLAIN DRY (MISCELLANEOUS) ×6 IMPLANT
CAP PROTECTIVE TRANSFX 4.5X5MM (EXFIX) ×6 IMPLANT
CHLORAPREP W/TINT 26 (MISCELLANEOUS) ×3 IMPLANT
CLAMP BLUE BAR TO BAR (EXFIX) ×6 IMPLANT
CLAMP BLUE BAR TO PIN (EXFIX) ×12 IMPLANT
CLOSURE WOUND 1/2 X4 (GAUZE/BANDAGES/DRESSINGS)
COVER SURGICAL LIGHT HANDLE (MISCELLANEOUS) ×6 IMPLANT
DRAPE C-ARM 42X72 X-RAY (DRAPES) IMPLANT
DRAPE C-ARMOR (DRAPES) ×3 IMPLANT
DRAPE IMP U-DRAPE 54X76 (DRAPES) ×6 IMPLANT
DRAPE ORTHO SPLIT 77X108 STRL (DRAPES) ×4
DRAPE SURG ORHT 6 SPLT 77X108 (DRAPES) ×2 IMPLANT
DRAPE U-SHAPE 47X51 STRL (DRAPES) ×3 IMPLANT
DRILL CANN 4.0MM (BIT) ×3
ELECT REM PT RETURN 9FT ADLT (ELECTROSURGICAL) ×3
ELECTRODE REM PT RTRN 9FT ADLT (ELECTROSURGICAL) ×1 IMPLANT
GAUZE SPONGE 4X4 12PLY STRL (GAUZE/BANDAGES/DRESSINGS) ×3 IMPLANT
GLOVE BIO SURGEON STRL SZ 6.5 (GLOVE) ×6 IMPLANT
GLOVE BIO SURGEON STRL SZ7.5 (GLOVE) ×12 IMPLANT
GLOVE BIO SURGEONS STRL SZ 6.5 (GLOVE) ×3
GLOVE BIOGEL PI IND STRL 6.5 (GLOVE) ×1 IMPLANT
GLOVE BIOGEL PI IND STRL 7.5 (GLOVE) ×1 IMPLANT
GLOVE BIOGEL PI INDICATOR 6.5 (GLOVE) ×2
GLOVE BIOGEL PI INDICATOR 7.5 (GLOVE) ×2
GOWN STRL REUS W/ TWL LRG LVL3 (GOWN DISPOSABLE) ×2 IMPLANT
GOWN STRL REUS W/TWL LRG LVL3 (GOWN DISPOSABLE) ×4
KIT BASIN OR (CUSTOM PROCEDURE TRAY) ×3 IMPLANT
KIT TURNOVER KIT B (KITS) ×3 IMPLANT
NS IRRIG 1000ML POUR BTL (IV SOLUTION) ×3 IMPLANT
PACK ORTHO EXTREMITY (CUSTOM PROCEDURE TRAY) ×3 IMPLANT
PAD ARMBOARD 7.5X6 YLW CONV (MISCELLANEOUS) ×6 IMPLANT
PAD CAST 3X4 CTTN HI CHSV (CAST SUPPLIES) ×1 IMPLANT
PAD CAST 4YDX4 CTTN HI CHSV (CAST SUPPLIES) ×2 IMPLANT
PADDING CAST COTTON 3X4 STRL (CAST SUPPLIES) ×2
PADDING CAST COTTON 4X4 STRL (CAST SUPPLIES) ×4
PIN 4X100X20MM EXFIX LG BLUNT (EXFIX) ×6 IMPLANT
PIN CLAMP 2BAR 75MM BLUE (EXFIX) ×3 IMPLANT
PIN HALF 5X160X35 BLUNT TIP (EXFIX) ×6 IMPLANT
PIN TRANSFIXING 5.0 (EXFIX) ×3 IMPLANT
SPONGE LAP 18X18 RF (DISPOSABLE) ×3 IMPLANT
STRIP CLOSURE SKIN 1/2X4 (GAUZE/BANDAGES/DRESSINGS) IMPLANT
TOWEL GREEN STERILE (TOWEL DISPOSABLE) ×6 IMPLANT
TOWEL GREEN STERILE FF (TOWEL DISPOSABLE) ×6 IMPLANT
UNDERPAD 30X36 HEAVY ABSORB (UNDERPADS AND DIAPERS) ×3 IMPLANT

## 2019-11-01 NOTE — Transfer of Care (Addendum)
Immediate Anesthesia Transfer of Care Note  Patient: [facility]  Procedure(s) Performed: APPLICATION  OF EXTERNAL FIXATION LEG (Right Leg Lower)  Patient Location: PACU  Anesthesia Type:General  Level of Consciousness: awake  Airway & Oxygen Therapy: Patient Spontanous Breathing and Patient connected to face mask oxygen  Post-op Assessment: Report given to RN and Post -op Vital signs reviewed and stable  Post vital signs: Reviewed and stable  Last Vitals:  Vitals Value Taken Time  BP 119/78 11/01/19 1345  Temp    Pulse 58 11/01/19 1347  Resp 19 11/01/19 1347  SpO2 100 % 11/01/19 1347  Vitals shown include unvalidated device data.  Last Pain:  Vitals:   11/01/19 1223  TempSrc:   PainSc: 7          Complications: No complications documented.
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
## 2019-11-01 NOTE — Plan of Care (Signed)

## 2019-11-01 NOTE — Anesthesia Procedure Notes (Signed)
Procedure Name: Intubation Date/Time: 11/01/2019 12:45 PM Performed by: Julian Reil, CRNA Pre-anesthesia Checklist: Patient identified, Emergency Drugs available, Suction available and Patient being monitored Patient Re-evaluated:Patient Re-evaluated prior to induction Oxygen Delivery Method: Circle system utilized Preoxygenation: Pre-oxygenation with 100% oxygen Induction Type: IV induction Ventilation: Mask ventilation without difficulty Laryngoscope Size: Miller and 3 Grade View: Grade I Tube type: Oral Tube size: 7.0 mm Number of attempts: 1 Airway Equipment and Method: Stylet Placement Confirmation: ETT inserted through vocal cords under direct vision,  positive ETCO2 and breath sounds checked- equal and bilateral Secured at: 22 cm Tube secured with: Tape Dental Injury: Teeth and Oropharynx as per pre-operative assessment  Comments: 4x4s bite block used.

## 2019-11-01 NOTE — ED Notes (Signed)
Dr. Glick at bedside.  
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
## 2019-11-01 NOTE — Progress Notes (Signed)
Orthopedic Tech Progress Note Patient Details:  Colleen Morrison 1995-10-09 [id number]  Ortho Devices Type of Ortho Device: Post (short leg) splint, Stirrup splint Ortho Device/Splint Location: rle. I applied splint with the assist of an rn holding the leg. the pt was moving the leg as splint was being applied. Ortho Device/Splint Interventions: Ordered, Application   Post Interventions Patient Tolerated: Fair Instructions Provided: Care of device, Adjustment of device   [name] 11/01/2019, 6:07 AM
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
## 2019-11-01 NOTE — ED Notes (Signed)
Patient transported to CT 
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
## 2019-11-01 NOTE — Anesthesia Postprocedure Evaluation (Signed)
Anesthesia Post Note  Patient: Colleen Morrison  Procedure(s) Performed: APPLICATION  OF EXTERNAL FIXATION LEG (Right Leg Lower)     Patient location during evaluation: PACU Anesthesia Type: General Level of consciousness: awake and alert Pain management: pain level controlled Vital Signs Assessment: post-procedure vital signs reviewed and stable Respiratory status: spontaneous breathing, nonlabored ventilation, respiratory function stable and patient connected to nasal cannula oxygen Cardiovascular status: blood pressure returned to baseline and stable Postop Assessment: no apparent nausea or vomiting Anesthetic complications: no   No complications documented.  Last Vitals:  Vitals:   11/01/19 1626 11/01/19 1658  BP: 137/86 (!) 140/94  Pulse: (!) 53 64  Resp: 12 16  Temp: 36.7 C 36.7 C  SpO2: 96% 99%    Last Pain:  Vitals:   11/01/19 1658  TempSrc: Oral  PainSc:                  [name] [name]

## 2019-11-01 NOTE — Anesthesia Preprocedure Evaluation (Addendum)
Anesthesia Evaluation  Patient identified by MRN, date of birth, ID band Patient awake    Reviewed: Allergy & Precautions, NPO status , Patient's Chart, lab work & pertinent test results  Airway Mallampati: II  TM Distance: >3 FB Neck ROM: Full    Dental no notable dental hx.    Pulmonary former smoker,    Pulmonary exam normal breath sounds clear to auscultation       Cardiovascular negative cardio ROS Normal cardiovascular exam Rhythm:Regular Rate:Normal     Neuro/Psych PSYCHIATRIC DISORDERS Anxiety Depression Attention deficit hyperactivity disordernegative neurological ROS     GI/Hepatic negative GI ROS, (+)     substance abuse  ,   Endo/Other  PCOS (polycystic ovarian syndrome)  Renal/GU negative Renal ROS     Musculoskeletal Scoliosis   Abdominal   Peds  Hematology negative hematology ROS (+)   Anesthesia Other Findings Right pilon fracture  Reproductive/Obstetrics hcg negative                            Anesthesia Physical Anesthesia Plan  ASA: II  Anesthesia Plan: General   Post-op Pain Management:    Induction: Intravenous  PONV Risk Score and Plan: 3 and Ondansetron, Dexamethasone, Midazolam and Treatment may vary due to age or medical condition  Airway Management Planned: Oral ETT  Additional Equipment:   Intra-op Plan:   Post-operative Plan: Extubation in OR  Informed Consent: I have reviewed the patients History and Physical, chart, labs and discussed the procedure including the risks, benefits and alternatives for the proposed anesthesia with the patient or authorized representative who has indicated his/her understanding and acceptance.     Dental advisory given  Plan Discussed with: CRNA  Anesthesia Plan Comments:       Anesthesia Quick Evaluation

## 2019-11-01 NOTE — Plan of Care (Signed)
  Problem: Education: Goal: Knowledge of General Education information will improve Description: Including pain rating scale, medication(s)/side effects and non-pharmacologic comfort measures Outcome: Progressing   Problem: Pain Managment: Goal: General experience of comfort will improve Outcome: Progressing   Problem: Safety: Goal: Ability to remain free from injury will improve Outcome: Progressing   

## 2019-11-01 NOTE — ED Notes (Signed)
Op permit signed prior to pain medication. Mother at bedside all belongings given to mother Bo Mcclintock

## 2019-11-02 ENCOUNTER — Encounter (HOSPITAL_COMMUNITY): Payer: Self-pay | Admitting: Student

## 2019-11-02 MED ORDER — ACETAMINOPHEN 500 MG PO TABS
1000.0000 mg | ORAL_TABLET | Freq: Three times a day (TID) | ORAL | Status: DC
  Administered 2019-11-02 – 2019-11-05 (×9): 1000 mg via ORAL
  Filled 2019-11-02 (×9): qty 2

## 2019-11-02 MED ORDER — DIPHENHYDRAMINE HCL 25 MG PO CAPS
25.0000 mg | ORAL_CAPSULE | Freq: Four times a day (QID) | ORAL | Status: DC | PRN
  Administered 2019-11-02 – 2019-11-05 (×2): 25 mg via ORAL
  Filled 2019-11-02 (×2): qty 1
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
## 2019-11-02 NOTE — Plan of Care (Signed)
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
## 2019-11-03 LAB — HEPATIC FUNCTION PANEL
ALT: 49 U/L — ABNORMAL HIGH (ref 0–44)
AST: 53 U/L — ABNORMAL HIGH (ref 15–41)
Albumin: 3.2 g/dL — ABNORMAL LOW (ref 3.5–5.0)
Alkaline Phosphatase: 52 U/L (ref 38–126)
Bilirubin, Direct: 0.1 mg/dL (ref 0.0–0.2)
Indirect Bilirubin: 0.3 mg/dL (ref 0.3–0.9)
Total Bilirubin: 0.4 mg/dL (ref 0.3–1.2)
Total Protein: 6.1 g/dL — ABNORMAL LOW (ref 6.5–8.1)

## 2019-11-03 NOTE — Plan of Care (Signed)

## 2019-11-03 NOTE — Plan of Care (Signed)
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
## 2019-11-03 NOTE — Progress Notes (Signed)
Orthopedic Tech Progress Note Patient Details:  JENNAE HAKEEM Aug 09, 1995 [id number] Therapy called requesting a pair of CRUTCHES. Ortho Devices Type of Ortho Device: Crutches Ortho Device/Splint Location: rle. I applied splint with the assist of an rn holding the leg. the pt was moving the leg as splint was being applied. Ortho Device/Splint Interventions: Adjustment   Post Interventions Patient Tolerated: Well Instructions Provided: Care of device   Donald Pore 11/03/2019, 8:55 AM
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
## 2019-11-03 NOTE — TOC CAGE-AID Note (Signed)
Transition of Care [facility]) - CAGE-AID Screening   Patient Details  Name: Colleen Morrison MRN: [id number] Date of Birth: 11/01/1995  [facility] CM/SW Contact:    Jimmy Picket, LCSWA Phone Number: 11/03/2019, 11:46 AM   Clinical Narrative:  Pt reports alcohol use about every other days of varying amounts. Pt reports that she needs to slow down because she sees her alcohol use getting out of hand. CSW and pt discussed the importance of her health and safety. CSW reviewed resources and gave pt educational materials.   CAGE-AID Screening:    Have You Ever Felt You Ought to Cut Down on Your Drinking or Drug Use?: Yes Have People Annoyed You By Critizing Your Drinking Or Drug Use?: No Have You Felt Bad Or Guilty About Your Drinking Or Drug Use?: Yes Have You Ever Had a Drink or Used Drugs First Thing In The Morning to STeady Your Nerves or to Get Rid of a Hangover?: No CAGE-AID Score: 2  Substance Abuse Education Offered: Yes  Substance abuse interventions: Patient Counseling, Educational Materials  Jimmy Picket, Theresia Majors, [place] Clinical Social Worker [phone]
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
## 2019-11-04 ENCOUNTER — Inpatient Hospital Stay (HOSPITAL_COMMUNITY): Payer: 59

## 2019-11-04 ENCOUNTER — Encounter (HOSPITAL_COMMUNITY)
Admission: EM | Disposition: A | Discharge status: Home / Self Care | Payer: Self-pay | Source: Home / Self Care | Attending: Student

## 2019-11-04 ENCOUNTER — Inpatient Hospital Stay (HOSPITAL_COMMUNITY): Payer: 59 | Admitting: Registered Nurse

## 2019-11-04 HISTORY — PX: OPEN REDUCTION INTERNAL FIXATION (ORIF) TIBIA/FIBULA FRACTURE: SHX5992

## 2019-11-04 HISTORY — PX: EXTERNAL FIXATION REMOVAL: SHX5040

## 2019-11-04 SURGERY — OPEN REDUCTION INTERNAL FIXATION (ORIF) TIBIA/FIBULA FRACTURE
Anesthesia: General | Laterality: Right

## 2019-11-04 MED ORDER — OXYCODONE HCL 5 MG PO TABS
5.0000 mg | ORAL_TABLET | Freq: Once | ORAL | Status: AC | PRN
  Administered 2019-11-04: 5 mg via ORAL

## 2019-11-04 MED ORDER — BUPIVACAINE-EPINEPHRINE (PF) 0.5% -1:200000 IJ SOLN
INTRAMUSCULAR | Status: DC | PRN
  Administered 2019-11-04: 30 mL via PERINEURAL

## 2019-11-04 MED ORDER — FENTANYL CITRATE (PF) 250 MCG/5ML IJ SOLN
INTRAMUSCULAR | Status: AC
  Filled 2019-11-04: qty 5

## 2019-11-04 MED ORDER — PROPOFOL 10 MG/ML IV BOLUS
INTRAVENOUS | Status: AC
  Filled 2019-11-04: qty 20

## 2019-11-04 MED ORDER — OXYCODONE HCL 5 MG/5ML PO SOLN: 5.0000 mg | Freq: Once | ORAL | Status: AC | PRN

## 2019-11-04 MED ORDER — PROPOFOL 10 MG/ML IV BOLUS
INTRAVENOUS | Status: DC | PRN
  Administered 2019-11-04: 150 mg via INTRAVENOUS

## 2019-11-04 MED ORDER — MIDAZOLAM HCL 5 MG/5ML IJ SOLN
INTRAMUSCULAR | Status: DC | PRN
  Administered 2019-11-04: 2 mg via INTRAVENOUS

## 2019-11-04 MED ORDER — 0.9 % SODIUM CHLORIDE (POUR BTL) OPTIME
TOPICAL | Status: DC | PRN
  Administered 2019-11-04: 1000 mL

## 2019-11-04 MED ORDER — CEFAZOLIN SODIUM 1 G IJ SOLR
INTRAMUSCULAR | Status: AC
  Filled 2019-11-04: qty 20

## 2019-11-04 MED ORDER — ROPIVACAINE HCL 7.5 MG/ML IJ SOLN
INTRAMUSCULAR | Status: DC | PRN
  Administered 2019-11-04: 20 mL via PERINEURAL

## 2019-11-04 MED ORDER — FENTANYL CITRATE (PF) 100 MCG/2ML IJ SOLN
INTRAMUSCULAR | Status: AC
  Filled 2019-11-04: qty 2

## 2019-11-04 MED ORDER — LACTATED RINGERS IV SOLN
INTRAVENOUS | Status: DC | PRN
Start: 2019-11-04 — End: 2019-11-04

## 2019-11-04 MED ORDER — ONDANSETRON HCL 4 MG/2ML IJ SOLN
INTRAMUSCULAR | Status: AC
  Filled 2019-11-04: qty 2

## 2019-11-04 MED ORDER — DIPHENHYDRAMINE HCL 50 MG/ML IJ SOLN
INTRAMUSCULAR | Status: AC
  Filled 2019-11-04: qty 1

## 2019-11-04 MED ORDER — HYDROMORPHONE HCL 1 MG/ML IJ SOLN
INTRAMUSCULAR | Status: AC
  Filled 2019-11-04: qty 1

## 2019-11-04 MED ORDER — MIDAZOLAM HCL 2 MG/2ML IJ SOLN: 0.5000 mg | Freq: Once | INTRAMUSCULAR | Status: DC | PRN

## 2019-11-04 MED ORDER — CEFAZOLIN SODIUM-DEXTROSE 2-3 GM-%(50ML) IV SOLR
INTRAVENOUS | Status: DC | PRN
Start: 2019-11-04 — End: 2019-11-04
  Administered 2019-11-04: 2 g via INTRAVENOUS

## 2019-11-04 MED ORDER — SCOPOLAMINE 1 MG/3DAYS TD PT72
1.0000 | MEDICATED_PATCH | TRANSDERMAL | Status: DC
  Filled 2019-11-04: qty 1

## 2019-11-04 MED ORDER — CEFAZOLIN SODIUM-DEXTROSE 2-4 GM/100ML-% IV SOLN
2.0000 g | Freq: Three times a day (TID) | INTRAVENOUS | Status: AC
  Administered 2019-11-04 – 2019-11-05 (×3): 2 g via INTRAVENOUS
  Filled 2019-11-04 (×3): qty 100

## 2019-11-04 MED ORDER — SCOPOLAMINE 1 MG/3DAYS TD PT72
MEDICATED_PATCH | TRANSDERMAL | Status: AC
  Administered 2019-11-04: 1.5 mg via TRANSDERMAL
  Filled 2019-11-04: qty 1

## 2019-11-04 MED ORDER — MIDAZOLAM HCL 2 MG/2ML IJ SOLN
INTRAMUSCULAR | Status: AC
  Filled 2019-11-04: qty 2

## 2019-11-04 MED ORDER — DEXAMETHASONE SODIUM PHOSPHATE 10 MG/ML IJ SOLN
INTRAMUSCULAR | Status: DC | PRN
  Administered 2019-11-04: 5 mg via INTRAVENOUS

## 2019-11-04 MED ORDER — VANCOMYCIN HCL 1000 MG IV SOLR
INTRAVENOUS | Status: DC | PRN
  Administered 2019-11-04: 1000 mg via TOPICAL

## 2019-11-04 MED ORDER — FENTANYL CITRATE (PF) 250 MCG/5ML IJ SOLN
INTRAMUSCULAR | Status: DC | PRN
  Administered 2019-11-04: 25 ug via INTRAVENOUS
  Administered 2019-11-04 (×2): 100 ug via INTRAVENOUS
  Administered 2019-11-04: 25 ug via INTRAVENOUS

## 2019-11-04 MED ORDER — DEXMEDETOMIDINE HCL 200 MCG/2ML IV SOLN
INTRAVENOUS | Status: DC | PRN
  Administered 2019-11-04: 8 ug via INTRAVENOUS
  Administered 2019-11-04: 4 ug via INTRAVENOUS
  Administered 2019-11-04: 8 ug via INTRAVENOUS

## 2019-11-04 MED ORDER — ONDANSETRON HCL 4 MG/2ML IJ SOLN
INTRAMUSCULAR | Status: DC | PRN
  Administered 2019-11-04: 4 mg via INTRAVENOUS

## 2019-11-04 MED ORDER — DIPHENHYDRAMINE HCL 50 MG/ML IJ SOLN
INTRAMUSCULAR | Status: DC | PRN
Start: 2019-11-04 — End: 2019-11-04
  Administered 2019-11-04: 12.5 mg via INTRAVENOUS

## 2019-11-04 MED ORDER — OXYCODONE HCL 5 MG PO TABS
ORAL_TABLET | ORAL | Status: AC
  Filled 2019-11-04: qty 1

## 2019-11-04 MED ORDER — DEXAMETHASONE SODIUM PHOSPHATE 10 MG/ML IJ SOLN
INTRAMUSCULAR | Status: AC
  Filled 2019-11-04: qty 1

## 2019-11-04 MED ORDER — PROMETHAZINE HCL 25 MG/ML IJ SOLN: 6.2500 mg | INTRAMUSCULAR | Status: DC | PRN

## 2019-11-04 MED ORDER — VANCOMYCIN HCL 1000 MG IV SOLR
INTRAVENOUS | Status: AC
  Filled 2019-11-04: qty 1000

## 2019-11-04 MED ORDER — HYDROMORPHONE HCL 1 MG/ML IJ SOLN
0.2500 mg | INTRAMUSCULAR | Status: DC | PRN
  Administered 2019-11-04: 0.5 mg via INTRAVENOUS

## 2019-11-04 MED ORDER — LIDOCAINE 2% (20 MG/ML) 5 ML SYRINGE
INTRAMUSCULAR | Status: DC | PRN
  Administered 2019-11-04: 20 mg via INTRAVENOUS

## 2019-11-04 MED ORDER — LIDOCAINE 2% (20 MG/ML) 5 ML SYRINGE
INTRAMUSCULAR | Status: AC
  Filled 2019-11-04: qty 5

## 2019-11-04 MED ORDER — MEPERIDINE HCL 25 MG/ML IJ SOLN: 6.2500 mg | INTRAMUSCULAR | Status: DC | PRN

## 2019-11-04 SURGICAL SUPPLY — 69 items
BANDAGE ESMARK 6X9 LF (GAUZE/BANDAGES/DRESSINGS) ×1 IMPLANT
BIT DRILL 2.0 (BIT) ×1
BIT DRILL 2.0MM (BIT) ×1
BIT DRILL 2XNS DISP SS SM FRAG (BIT) ×1 IMPLANT
BIT DRILL 3.5X5.5 QC CALB (BIT) ×3 IMPLANT
BIT DRILL CALIBRATED 2.7 (BIT) ×2 IMPLANT
BIT DRILL CALIBRATED 2.7MM (BIT) ×1
BIT DRL 2XNS DISP SS SM FRAG (BIT) ×1
BNDG COHESIVE 4X5 TAN STRL (GAUZE/BANDAGES/DRESSINGS) ×3 IMPLANT
BNDG ELASTIC 4X5.8 VLCR STR LF (GAUZE/BANDAGES/DRESSINGS) ×3 IMPLANT
BNDG ELASTIC 6X5.8 VLCR STR LF (GAUZE/BANDAGES/DRESSINGS) ×3 IMPLANT
BNDG ESMARK 6X9 LF (GAUZE/BANDAGES/DRESSINGS) ×3
BONE CANC CHIPS 20CC PCAN1/4 (Bone Implant) ×3 IMPLANT
CHIPS CANC BONE 20CC PCAN1/4 (Bone Implant) ×1 IMPLANT
CHLORAPREP W/TINT 26 (MISCELLANEOUS) ×6 IMPLANT
COVER SURGICAL LIGHT HANDLE (MISCELLANEOUS) ×3 IMPLANT
COVER WAND RF STERILE (DRAPES) ×3 IMPLANT
DRAPE C-ARM 42X72 X-RAY (DRAPES) ×3 IMPLANT
DRAPE C-ARMOR (DRAPES) ×3 IMPLANT
DRAPE ORTHO SPLIT 77X108 STRL (DRAPES) ×4
DRAPE SURG ORHT 6 SPLT 77X108 (DRAPES) ×2 IMPLANT
DRAPE U-SHAPE 47X51 STRL (DRAPES) ×3 IMPLANT
DRSG MEPITEL 4X7.2 (GAUZE/BANDAGES/DRESSINGS) ×3 IMPLANT
ELECT REM PT RETURN 9FT ADLT (ELECTROSURGICAL) ×3
ELECTRODE REM PT RTRN 9FT ADLT (ELECTROSURGICAL) ×1 IMPLANT
GAUZE SPONGE 4X4 12PLY STRL (GAUZE/BANDAGES/DRESSINGS) ×3 IMPLANT
GLOVE BIO SURGEON STRL SZ 6.5 (GLOVE) ×6 IMPLANT
GLOVE BIO SURGEON STRL SZ7.5 (GLOVE) ×12 IMPLANT
GLOVE BIO SURGEONS STRL SZ 6.5 (GLOVE) ×3
GLOVE BIOGEL PI IND STRL 6.5 (GLOVE) ×1 IMPLANT
GLOVE BIOGEL PI IND STRL 7.5 (GLOVE) ×1 IMPLANT
GLOVE BIOGEL PI INDICATOR 6.5 (GLOVE) ×2
GLOVE BIOGEL PI INDICATOR 7.5 (GLOVE) ×2
GOWN STRL REUS W/ TWL LRG LVL3 (GOWN DISPOSABLE) ×2 IMPLANT
GOWN STRL REUS W/TWL LRG LVL3 (GOWN DISPOSABLE) ×4
K-WIRE ACE 1.6X6 (WIRE) ×15
KIT BASIN OR (CUSTOM PROCEDURE TRAY) ×3 IMPLANT
KIT TURNOVER KIT B (KITS) ×3 IMPLANT
KWIRE ACE 1.6X6 (WIRE) ×5 IMPLANT
NS IRRIG 1000ML POUR BTL (IV SOLUTION) ×3 IMPLANT
PACK TOTAL JOINT (CUSTOM PROCEDURE TRAY) ×3 IMPLANT
PAD CAST 4YDX4 CTTN HI CHSV (CAST SUPPLIES) ×1 IMPLANT
PADDING CAST COTTON 4X4 STRL (CAST SUPPLIES) ×2
PADDING CAST COTTON 6X4 STRL (CAST SUPPLIES) ×3 IMPLANT
PLATE 6H RT DIST ANTLAT TIB (Plate) ×2 IMPLANT
PLATE ANTLAT CNTR NAR 114X6 (Plate) ×1 IMPLANT
PLATE LOCK 7H 92 BILAT FIB (Plate) ×3 IMPLANT
SCREW CORT FT 32X3.5XNONLOCK (Screw) ×1 IMPLANT
SCREW CORTICAL 2.7MM 46MM (Screw) ×3 IMPLANT
SCREW CORTICAL 3.5MM  32MM (Screw) ×2 IMPLANT
SCREW LOCK CORT STAR 3.5X10 (Screw) ×3 IMPLANT
SCREW LOCK CORT STAR 3.5X18 (Screw) ×3 IMPLANT
SCREW LOCK CORT STAR 3.5X36 (Screw) ×3 IMPLANT
SCREW LOCK CORT STAR 3.5X40 (Screw) ×6 IMPLANT
SCREW LOCK CORT STAR 3.5X46 (Screw) ×3 IMPLANT
SCREW LOW PROF TIS 3.5X28MM (Screw) ×6 IMPLANT
SCREW LP NL T15 3.5X24 (Screw) ×3 IMPLANT
SCREW LP NL T15 3.5X26 (Screw) ×3 IMPLANT
SCREW T15 LP CORT 3.5X46MM NS (Screw) ×3 IMPLANT
SPLINT PLASTER CAST XFAST 5X30 (CAST SUPPLIES) ×1 IMPLANT
SPLINT PLASTER XFAST SET 5X30 (CAST SUPPLIES) ×2
SUT ETHILON 3 0 FSL (SUTURE) ×9 IMPLANT
SUT PROLENE 0 CT (SUTURE) IMPLANT
SUT VIC AB 0 CT1 27 (SUTURE) ×2
SUT VIC AB 0 CT1 27XBRD ANBCTR (SUTURE) ×1 IMPLANT
SYR CONTROL 10ML LL (SYRINGE) ×3 IMPLANT
TOWEL GREEN STERILE (TOWEL DISPOSABLE) ×6 IMPLANT
TOWEL GREEN STERILE FF (TOWEL DISPOSABLE) ×6 IMPLANT
UNDERPAD 30X36 HEAVY ABSORB (UNDERPADS AND DIAPERS) ×3 IMPLANT

## 2019-11-04 NOTE — Plan of Care (Signed)

## 2019-11-04 NOTE — Transfer of Care (Signed)
Immediate Anesthesia Transfer of Care Note  Patient: Colleen Morrison  Procedure(s) Performed: OPEN REDUCTION INTERNAL FIXATION (ORIF) TIBIA/FIBULA FRACTURE (Right ) REMOVAL EXTERNAL FIXATION LEG (Right )  Patient Location: PACU  Anesthesia Type:General  Level of Consciousness: awake, alert  and oriented  Airway & Oxygen Therapy: Patient Spontanous Breathing and Patient connected to nasal cannula oxygen  Post-op Assessment: Report given to RN and Post -op Vital signs reviewed and stable  Post vital signs: Reviewed and stable  Last Vitals:  Vitals Value Taken Time  BP 138/100 11/04/19 1139  Temp    Pulse 66 11/04/19 1143  Resp 14 11/04/19 1143  SpO2 96 % 11/04/19 1143  Vitals shown include unvalidated device data.  Last Pain:  Vitals:   11/04/19 1124  TempSrc:   PainSc: 10-Worst pain ever      Patients Stated Pain Goal: 3 (11/04/19 1100)  Complications: No complications documented.
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
## 2019-11-04 NOTE — Anesthesia Procedure Notes (Signed)
Anesthesia Regional Block: Popliteal block   Pre-Anesthetic Checklist: ,, timeout performed, Correct Patient, Correct Site, Correct Laterality, Correct Procedure, Correct Position, site marked, Risks and benefits discussed,  Surgical consent,  Pre-op evaluation,  At surgeon's request and post-op pain management  Laterality: Right and Lower  Prep: chloraprep       Needles:  Injection technique: Single-shot  Needle Type: Echogenic Needle     Needle Length: 9cm  Needle Gauge: 21     Additional Needles:   Procedures:, nerve stimulator,,, ultrasound used (permanent image in chart),,,,   Nerve Stimulator or Paresthesia:  Response: toe twitches, 0.4 mA, 0.1 ms,   Additional Responses:   Narrative:  Start time: 11/04/2019 7:24 AM End time: 11/04/2019 7:33 AM Injection made incrementally with aspirations every 5 mL.  Performed by: Personally  Anesthesiologist: Jairo Ben, MD  Additional Notes: Pt identified in Holding room.  Monitors applied. Working IV access confirmed. Sterile prep R lateral knee.  #21ga ECHOgenic PNS to toe twitches at 0.74mA threshold.  30cc 0.5% Bupivacaine with 1:200k epi injected incrementally after negative test dose.  Patient asymptomatic, VSS, no heme aspirated, tolerated well.  Sandford Craze, MD

## 2019-11-04 NOTE — Anesthesia Postprocedure Evaluation (Signed)
Anesthesia Post Note  Patient: Colleen Morrison  Procedure(s) Performed: OPEN REDUCTION INTERNAL FIXATION (ORIF) TIBIA/FIBULA FRACTURE (Right ) REMOVAL EXTERNAL FIXATION LEG (Right )     Patient location during evaluation: PACU Anesthesia Type: General Level of consciousness: awake and alert, oriented and patient cooperative Pain management: pain level controlled (pt says her foot is numb) Vital Signs Assessment: post-procedure vital signs reviewed and stable Respiratory status: spontaneous breathing, nonlabored ventilation and respiratory function stable Cardiovascular status: blood pressure returned to baseline and stable Postop Assessment: no apparent nausea or vomiting Anesthetic complications: no   No complications documented.  Last Vitals:  Vitals:   11/04/19 1139 11/04/19 1145  BP: (!) 138/100   Pulse: 83   Resp: 16   Temp:  37 C  SpO2: 96%     Last Pain:  Vitals:   11/04/19 1130  TempSrc:   PainSc: Asleep                 Nirvana Blanchett,E. Diera Wirkkala

## 2019-11-04 NOTE — Anesthesia Procedure Notes (Addendum)
Procedure Name: LMA Insertion Date/Time: 11/04/2019 7:53 AM Performed by: Laruth Bouchard., CRNA Pre-anesthesia Checklist: Emergency Drugs available, Patient being monitored, Patient identified and Suction available Patient Re-evaluated:Patient Re-evaluated prior to induction Oxygen Delivery Method: Circle system utilized Preoxygenation: Pre-oxygenation with 100% oxygen Induction Type: IV induction Ventilation: Mask ventilation without difficulty LMA: LMA inserted LMA Size: 4.0 Number of attempts: 1 Placement Confirmation: positive ETCO2 and breath sounds checked- equal and bilateral Tube secured with: Tape Dental Injury: Teeth and Oropharynx as per pre-operative assessment

## 2019-11-04 NOTE — Anesthesia Preprocedure Evaluation (Signed)
Anesthesia Evaluation  Patient identified by MRN, date of birth, ID band Patient awake    Reviewed: Allergy & Precautions, NPO status , Patient's Chart, lab work & pertinent test results  History of Anesthesia Complications Negative for: history of anesthetic complications  Airway Mallampati: I  TM Distance: >3 FB Neck ROM: Full    Dental  (+) Dental Advisory Given, Teeth Intact   Pulmonary Current Smoker and Patient abstained from smoking.,  11/01/2019 SARS coronavirus NEG   breath sounds clear to auscultation       Cardiovascular negative cardio ROS   Rhythm:Regular Rate:Normal     Neuro/Psych Anxiety Depression Bipolar Disorder negative neurological ROS     GI/Hepatic negative GI ROS, Elevated LFTs   Endo/Other  PCOS  Renal/GU negative Renal ROS     Musculoskeletal   Abdominal   Peds  Hematology negative hematology ROS (+)   Anesthesia Other Findings   Reproductive/Obstetrics                             Anesthesia Physical Anesthesia Plan  ASA: II  Anesthesia Plan: General   Post-op Pain Management: GA combined w/ Regional for post-op pain   Induction: Intravenous  PONV Risk Score and Plan: 3 and Ondansetron, Dexamethasone and Scopolamine patch - Pre-op  Airway Management Planned: LMA  Additional Equipment: None  Intra-op Plan:   Post-operative Plan:   Informed Consent: I have reviewed the patients History and Physical, chart, labs and discussed the procedure including the risks, benefits and alternatives for the proposed anesthesia with the patient or authorized representative who has indicated his/her understanding and acceptance.     Dental advisory given  Plan Discussed with: CRNA and Surgeon  Anesthesia Plan Comments: (Plan routine monitors, Adductor canal and popliteal blocks for post op analgesia)        Anesthesia Quick Evaluation
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
## 2019-11-04 NOTE — Progress Notes (Signed)
Pt arrived to unit from PACU accompanied by PACU staff. Pt alert/oriented in no apparent distress. No complaints.

## 2019-11-04 NOTE — Anesthesia Procedure Notes (Signed)
Anesthesia Regional Block: Adductor canal block   Pre-Anesthetic Checklist: ,, timeout performed, Correct Patient, Correct Site, Correct Laterality, Correct Procedure, Correct Position, site marked, Risks and benefits discussed,  Surgical consent,  Pre-op evaluation,  At surgeon's request and post-op pain management  Laterality: Right and Lower  Prep: chloraprep       Needles:  Injection technique: Single-shot  Needle Type: Echogenic Needle     Needle Length: 9cm  Needle Gauge: 21     Additional Needles:   Procedures:,,,, ultrasound used (permanent image in chart),,,,  Narrative:  Start time: 11/04/2019 7:16 AM End time: 11/04/2019 7:23 AM Injection made incrementally with aspirations every 5 mL.  Performed by: Personally  Anesthesiologist: Jairo Ben, MD  Additional Notes: Pt identified in Holding room.  Monitors applied. Working IV access confirmed. Sterile prep R thigh.  #21ga ECHOgenic needle into adductor canal with US guidance.  20cc 0.75% Ropivacaine injected incrementally after negative test dose.  Patient asymptomatic, VSS, no heme aspirated, tolerated well.  Sandford Craze, MD

## 2019-11-04 NOTE — Plan of Care (Signed)
  Problem: Education: Goal: Knowledge of General Education information will improve Description: Including pain rating scale, medication(s)/side effects and non-pharmacologic comfort measures Outcome: Progressing   Problem: Health Behavior/Discharge Planning: Goal: Ability to manage health-related needs will improve Outcome: Progressing   Problem: Activity: Goal: Risk for activity intolerance will decrease Outcome: Progressing   Problem: Elimination: Goal: Will not experience complications related to bowel motility Outcome: Progressing   Problem: Pain Managment: Goal: General experience of comfort will improve Outcome: Progressing   Problem: Safety: Goal: Ability to remain free from injury will improve Outcome: Progressing   Problem: Skin Integrity: Goal: Risk for impaired skin integrity will decrease Outcome: Progressing   

## 2019-11-04 NOTE — Interval H&P Note (Signed)
History and Physical Interval Note:  11/04/2019 7:12 AM  Colleen Morrison  has presented today for surgery, with the diagnosis of Right pilon fracture.  The various methods of treatment have been discussed with the patient and family. After consideration of risks, benefits and other options for treatment, the patient has consented to  Procedure(s): OPEN REDUCTION INTERNAL FIXATION (ORIF) TIBIA/FIBULA FRACTURE (Right) REMOVAL EXTERNAL FIXATION LEG (Right) as a surgical intervention.  The patient's history has been reviewed, patient examined, no change in status, stable for surgery.  I have reviewed the patient's chart and labs.  Questions were answered to the patient's satisfaction.     Caryn Bee P Elainna Eshleman

## 2019-11-05 ENCOUNTER — Encounter (HOSPITAL_COMMUNITY): Payer: Self-pay | Admitting: Student

## 2019-11-05 MED ORDER — ASPIRIN EC 325 MG PO TBEC
325.0000 mg | DELAYED_RELEASE_TABLET | Freq: Every day | ORAL | 0 refills | Status: AC
Start: 2019-11-05 — End: 2019-12-05

## 2019-11-05 MED ORDER — METHOCARBAMOL 500 MG PO TABS
500.0000 mg | ORAL_TABLET | Freq: Four times a day (QID) | ORAL | Status: DC | PRN
  Administered 2019-11-05: 500 mg via ORAL
  Filled 2019-11-05: qty 1

## 2019-11-05 MED ORDER — GABAPENTIN 100 MG PO CAPS: 100.0000 mg | ORAL_CAPSULE | Freq: Three times a day (TID) | ORAL | 0 refills | Status: AC

## 2019-11-05 MED ORDER — HYDROMORPHONE HCL 2 MG PO TABS
2.0000 mg | ORAL_TABLET | ORAL | Status: DC | PRN
  Administered 2019-11-05: 2 mg via ORAL
  Filled 2019-11-05: qty 1

## 2019-11-05 MED ORDER — METHOCARBAMOL 500 MG PO TABS: 500.0000 mg | ORAL_TABLET | Freq: Four times a day (QID) | ORAL | 1 refills | Status: AC | PRN

## 2019-11-05 MED ORDER — HYDROMORPHONE HCL 2 MG PO TABS: 2.0000 mg | ORAL_TABLET | ORAL | 0 refills | Status: AC | PRN

## 2019-11-05 MED ORDER — ACETAMINOPHEN 500 MG PO TABS: 1000.0000 mg | ORAL_TABLET | Freq: Three times a day (TID) | ORAL | 0 refills | Status: AC

## 2019-11-05 MED ORDER — HYDROMORPHONE HCL 1 MG/ML IJ SOLN
1.0000 mg | INTRAMUSCULAR | Status: DC | PRN
  Administered 2019-11-05: 1.5 mg via INTRAVENOUS
  Filled 2019-11-05: qty 1

## 2019-11-05 NOTE — Progress Notes (Signed)
Discharge summary provided to pt/mom with instrucitons. Mother/pt verbalized understanding of instructions No complaints voiced. Pt alert/oriented in no apparent distress. Discharge as ordered.

## 2019-11-05 NOTE — Progress Notes (Signed)
Pt c/o constant severe surgical pain unrelieved or has slight effect by PRN meds (PO and IV) to  R lower leg/ankle/foot. She has crying/moaning episodes every time she is in pain, and stated "the pain is worst" Elevation of extremity, cold therapy, emotional support , repositioning and PRN pain medication were given throughout the night and notified PA around 520a regarding the patient current status and writer requested to evaluate pain meds and add muscle relaxant and received new orders  to manage pain. PRN Dilaudid 1.5mg  IV with scheduled Tylenol and Robaxin 500mg  po were given at 0532, currently resident is asleep upon follow up. Will cont. To monitor.

## 2019-11-05 NOTE — Discharge Instructions (Signed)
[facility] Discharge Instructions   General Discharge Instructions  WEIGHT BEARING STATUS:Nonweightbearing to right leg  RANGE OF MOTION/ACTIVITY:No limitations to motion. Keep leg elevated to keep swelling down  Wound Care:Keep splint clean dry and intact  DVT/PE prophylaxis: Aspirin but if causes swelling, please discontinue and call office to be started on Lovenox  Diet: as you were eating previously.  Can use over the counter stool softeners and bowel preparations, such as Miralax, to help with bowel movements.  Narcotics can be constipating.  Be sure to drink plenty of fluids  PAIN MEDICATION USE AND EXPECTATIONS  You have likely been given narcotic medications to help control your pain.  After a traumatic event that results in an fracture (broken bone) with or without surgery, it is ok to use narcotic pain medications to help control one's pain.  We understand that everyone responds to pain differently and each individual patient will be evaluated on a regular basis for the continued need for narcotic medications. Ideally, narcotic medication use should last no more than 6-8 weeks (coinciding with fracture healing).   As a patient it is your responsibility as well to monitor narcotic medication use and report the amount and frequency you use these medications when you come to your office visit.   We would also advise that if you are using narcotic medications, you should take a dose prior to therapy to maximize you participation.  IF YOU ARE ON NARCOTIC MEDICATIONS IT IS NOT PERMISSIBLE TO OPERATE A MOTOR VEHICLE (MOTORCYCLE/CAR/TRUCK/MOPED) OR HEAVY MACHINERY DO NOT MIX NARCOTICS WITH OTHER CNS (CENTRAL NERVOUS SYSTEM) DEPRESSANTS SUCH AS ALCOHOL   STOP SMOKING OR USING NICOTINE PRODUCTS!!!!  As discussed nicotine severely impairs your body's ability to heal surgical and traumatic wounds but also impairs bone healing.  Wounds and bone heal by forming microscopic  blood vessels (angiogenesis) and nicotine is a vasoconstrictor (essentially, shrinks blood vessels).  Therefore, if vasoconstriction occurs to these microscopic blood vessels they essentially disappear and are unable to deliver necessary nutrients to the healing tissue.  This is one modifiable factor that you can do to dramatically increase your chances of healing your injury.    (This means no smoking, no nicotine gum, patches, etc)  DO NOT USE NONSTEROIDAL ANTI-INFLAMMATORY DRUGS (NSAID'S)  Using products such as Advil (ibuprofen), Aleve (naproxen), Motrin (ibuprofen) for additional pain control during fracture healing can delay and/or prevent the healing response.  If you would like to take over the counter (OTC) medication, Tylenol (acetaminophen) is ok.  However, some narcotic medications that are given for pain control contain acetaminophen as well. Therefore, you should not exceed more than 4000 mg of tylenol in a day if you do not have liver disease.  Also note that there are may OTC medicines, such as cold medicines and allergy medicines that my contain tylenol as well.  If you have any questions about medications and/or interactions please ask your doctor/PA or your pharmacist.      ICE AND ELEVATE INJURED/OPERATIVE EXTREMITY  Using ice and elevating the injured extremity above your heart can help with swelling and pain control.  Icing in a pulsatile fashion, such as 20 minutes on and 20 minutes off, can be followed.    Do not place ice directly on skin. Make sure there is a barrier between to skin and the ice pack.    Using frozen items such as frozen peas works well as the conform nicely to the are that needs to be iced.  USE AN ACE WRAP OR TED HOSE FOR SWELLING CONTROL  In addition to icing and elevation, Ace wraps or TED hose are used to help limit and resolve swelling.  It is recommended to use Ace wraps or TED hose until you are informed to stop.    When using Ace Wraps start the  wrapping distally (farthest away from the body) and wrap proximally (closer to the body)   Example: If you had surgery on your leg or thing and you do not have a splint on, start the ace wrap at the toes and work your way up to the thigh        If you had surgery on your upper extremity and do not have a splint on, start the ace wrap at your fingers and work your way up to the upper arm  IF YOU ARE IN A SPLINT OR CAST DO NOT [place]   If your splint gets wet for any reason please contact the office immediately. You may shower in your splint or cast as long as you keep it dry.  This can be done by wrapping in a cast cover or garbage back (or similar)  Do Not stick any thing down your splint or cast such as pencils, money, or hangers to try and scratch yourself with.  If you feel itchy take benadryl as prescribed on the bottle for itching  IF YOU ARE IN A CAM BOOT (BLACK BOOT)  You may remove boot periodically. Perform daily dressing changes as noted below.  Wash the liner of the boot regularly and wear a sock when wearing the boot. It is recommended that you sleep in the boot until told otherwise   CALL THE OFFICE WITH ANY QUESTIONS OR CONCERNS: [phone]   VISIT OUR WEBSITE FOR ADDITIONAL INFORMATION: [email or web address]

## 2019-11-05 NOTE — Plan of Care (Signed)
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
## 2019-11-05 NOTE — Plan of Care (Signed)

## 2019-12-01 ENCOUNTER — Ambulatory Visit: Payer: 59 | Admitting: Physical Therapy

## 2019-12-07 ENCOUNTER — Encounter: Payer: Self-pay | Admitting: Physical Therapy

## 2019-12-07 ENCOUNTER — Other Ambulatory Visit: Payer: Self-pay

## 2019-12-07 ENCOUNTER — Ambulatory Visit: Payer: 59 | Attending: Student | Admitting: Physical Therapy

## 2019-12-07 DIAGNOSIS — M25571 Pain in right ankle and joints of right foot: Secondary | ICD-10-CM

## 2019-12-07 DIAGNOSIS — M25671 Stiffness of right ankle, not elsewhere classified: Secondary | ICD-10-CM | POA: Diagnosis present

## 2019-12-07 DIAGNOSIS — R262 Difficulty in walking, not elsewhere classified: Secondary | ICD-10-CM | POA: Insufficient documentation

## 2019-12-07 DIAGNOSIS — R600 Localized edema: Secondary | ICD-10-CM | POA: Insufficient documentation
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
## 2019-12-14 ENCOUNTER — Ambulatory Visit: Payer: 59 | Admitting: *Deleted

## 2019-12-15 ENCOUNTER — Ambulatory Visit: Payer: 59 | Admitting: Physical Therapy

## 2019-12-16 ENCOUNTER — Encounter: Payer: Self-pay | Admitting: Physical Therapy

## 2019-12-16 ENCOUNTER — Ambulatory Visit: Payer: 59 | Attending: Student | Admitting: Physical Therapy

## 2019-12-16 ENCOUNTER — Other Ambulatory Visit: Payer: Self-pay

## 2019-12-16 DIAGNOSIS — R262 Difficulty in walking, not elsewhere classified: Secondary | ICD-10-CM | POA: Insufficient documentation

## 2019-12-16 DIAGNOSIS — M25671 Stiffness of right ankle, not elsewhere classified: Secondary | ICD-10-CM | POA: Insufficient documentation

## 2019-12-16 DIAGNOSIS — M25571 Pain in right ankle and joints of right foot: Secondary | ICD-10-CM | POA: Insufficient documentation

## 2019-12-16 DIAGNOSIS — R600 Localized edema: Secondary | ICD-10-CM | POA: Diagnosis present
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
## 2019-12-28 ENCOUNTER — Encounter: Payer: 59 | Admitting: *Deleted

## 2019-12-29 ENCOUNTER — Encounter: Payer: 59 | Admitting: Physical Therapy

## 2019-12-30 ENCOUNTER — Ambulatory Visit: Payer: 59 | Admitting: *Deleted

## 2019-12-31 ENCOUNTER — Encounter: Payer: Self-pay | Admitting: Physical Therapy

## 2019-12-31 ENCOUNTER — Ambulatory Visit: Payer: 59 | Admitting: Physical Therapy

## 2019-12-31 ENCOUNTER — Other Ambulatory Visit: Payer: Self-pay

## 2019-12-31 DIAGNOSIS — M25671 Stiffness of right ankle, not elsewhere classified: Secondary | ICD-10-CM

## 2019-12-31 DIAGNOSIS — M25571 Pain in right ankle and joints of right foot: Secondary | ICD-10-CM | POA: Diagnosis not present

## 2019-12-31 DIAGNOSIS — R600 Localized edema: Secondary | ICD-10-CM

## 2019-12-31 DIAGNOSIS — R262 Difficulty in walking, not elsewhere classified: Secondary | ICD-10-CM
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
## 2020-01-03 ENCOUNTER — Ambulatory Visit: Payer: 59 | Admitting: Physical Therapy

## 2020-01-04 ENCOUNTER — Encounter: Payer: 59 | Admitting: *Deleted

## 2020-01-05 ENCOUNTER — Encounter: Payer: Self-pay | Admitting: Physical Therapy

## 2020-01-05 ENCOUNTER — Other Ambulatory Visit: Payer: Self-pay

## 2020-01-05 ENCOUNTER — Ambulatory Visit: Payer: 59 | Admitting: Physical Therapy

## 2020-01-05 DIAGNOSIS — R262 Difficulty in walking, not elsewhere classified: Secondary | ICD-10-CM

## 2020-01-05 DIAGNOSIS — M25571 Pain in right ankle and joints of right foot: Secondary | ICD-10-CM | POA: Diagnosis not present

## 2020-01-05 DIAGNOSIS — R600 Localized edema: Secondary | ICD-10-CM

## 2020-01-05 DIAGNOSIS — M25671 Stiffness of right ankle, not elsewhere classified: Secondary | ICD-10-CM
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
## 2020-01-10 ENCOUNTER — Ambulatory Visit: Payer: 59 | Admitting: Physical Therapy

## 2020-01-11 ENCOUNTER — Ambulatory Visit: Payer: 59 | Admitting: Physical Therapy
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
## 2021-02-11 ENCOUNTER — Encounter (HOSPITAL_BASED_OUTPATIENT_CLINIC_OR_DEPARTMENT_OTHER): Payer: Self-pay

## 2021-02-11 ENCOUNTER — Emergency Department (HOSPITAL_BASED_OUTPATIENT_CLINIC_OR_DEPARTMENT_OTHER): Payer: 59

## 2021-02-11 ENCOUNTER — Emergency Department (HOSPITAL_BASED_OUTPATIENT_CLINIC_OR_DEPARTMENT_OTHER)
Admission: EM | Admit: 2021-02-11 | Discharge: 2021-02-11 | Disposition: A | Discharge status: Home / Self Care | Payer: 59 | Attending: Emergency Medicine | Admitting: Emergency Medicine

## 2021-02-11 ENCOUNTER — Other Ambulatory Visit: Payer: Self-pay

## 2021-02-11 DIAGNOSIS — Z87891 Personal history of nicotine dependence: Secondary | ICD-10-CM | POA: Insufficient documentation

## 2021-02-11 DIAGNOSIS — S99911A Unspecified injury of right ankle, initial encounter: Secondary | ICD-10-CM | POA: Diagnosis not present

## 2021-02-11 DIAGNOSIS — Z79899 Other long term (current) drug therapy: Secondary | ICD-10-CM | POA: Insufficient documentation

## 2021-02-11 DIAGNOSIS — X501XXA Overexertion from prolonged static or awkward postures, initial encounter: Secondary | ICD-10-CM | POA: Diagnosis not present

## 2021-02-11 NOTE — ED Triage Notes (Signed)
She states she twisted her right ankle yesterday and c/o persistent ankle pain. She tells [place] she had internal fixation of her right ankle about a year ago.
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
## 2021-07-07 ENCOUNTER — Emergency Department (HOSPITAL_BASED_OUTPATIENT_CLINIC_OR_DEPARTMENT_OTHER)
Admission: EM | Admit: 2021-07-07 | Discharge: 2021-07-07 | Disposition: A | Discharge status: Home / Self Care | Payer: BLUE CROSS/BLUE SHIELD | Attending: Emergency Medicine | Admitting: Emergency Medicine

## 2021-07-07 ENCOUNTER — Encounter (HOSPITAL_BASED_OUTPATIENT_CLINIC_OR_DEPARTMENT_OTHER): Payer: Self-pay

## 2021-07-07 DIAGNOSIS — R109 Unspecified abdominal pain: Secondary | ICD-10-CM

## 2021-07-07 DIAGNOSIS — R1084 Generalized abdominal pain: Secondary | ICD-10-CM | POA: Insufficient documentation

## 2021-07-07 DIAGNOSIS — R110 Nausea: Secondary | ICD-10-CM | POA: Insufficient documentation

## 2021-07-07 LAB — COMPREHENSIVE METABOLIC PANEL
ALT: 14 U/L (ref 0–44)
AST: 19 U/L (ref 15–41)
Albumin: 4.2 g/dL (ref 3.5–5.0)
Alkaline Phosphatase: 53 U/L (ref 38–126)
Anion gap: 8 (ref 5–15)
BUN: 15 mg/dL (ref 6–20)
CO2: 25 mmol/L (ref 22–32)
Calcium: 9.4 mg/dL (ref 8.9–10.3)
Chloride: 105 mmol/L (ref 98–111)
Creatinine, Ser: 0.76 mg/dL (ref 0.44–1.00)
GFR, Estimated: >60 mL/min (ref >60)
Glucose, Bld: 88 mg/dL (ref 70–99)
Potassium: 3.8 mmol/L (ref 3.5–5.1)
Sodium: 138 mmol/L (ref 135–145)
Total Bilirubin: 0.5 mg/dL (ref 0.3–1.2)
Total Protein: 6.2 g/dL — ABNORMAL LOW (ref 6.5–8.1)

## 2021-07-07 LAB — URINALYSIS, ROUTINE W REFLEX MICROSCOPIC
Bilirubin Urine: NEGATIVE
Glucose, UA: NEGATIVE mg/dL
Hgb urine dipstick: NEGATIVE
Ketones, ur: NEGATIVE mg/dL
Leukocytes,Ua: NEGATIVE
Nitrite: NEGATIVE
Specific Gravity, Urine: 1.022 (ref 1.005–1.030)
pH: 7 (ref 5.0–8.0)

## 2021-07-07 LAB — CBC WITH DIFFERENTIAL/PLATELET
Abs Immature Granulocytes: 0.02 10*3/uL (ref 0.00–0.07)
Basophils Absolute: 0.1 10*3/uL (ref 0.0–0.1)
Basophils Relative: 1 %
Eosinophils Absolute: 0 10*3/uL (ref 0.0–0.5)
Eosinophils Relative: 1 %
HCT: 40.4 % (ref 36.0–46.0)
Hemoglobin: 12.7 g/dL (ref 12.0–15.0)
Immature Granulocytes: 0 %
Lymphocytes Relative: 27 %
Lymphs Abs: 1.6 10*3/uL (ref 0.7–4.0)
MCH: 26.7 pg (ref 26.0–34.0)
MCHC: 31.4 g/dL (ref 30.0–36.0)
MCV: 84.9 fL (ref 80.0–100.0)
Monocytes Absolute: 0.5 10*3/uL (ref 0.1–1.0)
Monocytes Relative: 9 %
Neutro Abs: 3.6 10*3/uL (ref 1.7–7.7)
Neutrophils Relative %: 62 %
Platelets: 263 10*3/uL (ref 150–400)
RBC: 4.76 MIL/uL (ref 3.87–5.11)
RDW: 13.2 % (ref 11.5–15.5)
WBC: 5.9 10*3/uL (ref 4.0–10.5)
nRBC: 0 % (ref 0.0–0.2)

## 2021-07-07 LAB — LIPASE, BLOOD: Lipase: 25 U/L (ref 11–51)

## 2021-07-07 LAB — HCG, SERUM, QUALITATIVE: Preg, Serum: NEGATIVE

## 2021-07-07 MED ORDER — HYDROCODONE-ACETAMINOPHEN 5-325 MG PO TABS: 1.0000 | ORAL_TABLET | Freq: Four times a day (QID) | ORAL | 0 refills | Status: DC | PRN

## 2021-07-07 MED ORDER — HYDROCODONE-ACETAMINOPHEN 5-325 MG PO TABS: 1.0000 | ORAL_TABLET | Freq: Four times a day (QID) | ORAL | 0 refills | Status: AC | PRN

## 2021-07-07 NOTE — ED Provider Notes (Signed)
[facility] EMERGENCY DEPT Provider Note   CSN: [id number] Arrival date & time: 07/07/21  0813     History  Chief Complaint  Patient presents with   Abdominal Pain    Colleen Morrison is a 26 y.o. female.  Patient is a 26 yo female with pmh of nephrolithiasis, pcos, and gastric bypass presenting for abdominal pain. Pt admits to generalized abdominal pain with radiation straight to the back (points to lower thoracic region), intermittently, x 2 weeks, with associated nasuea. Denies fevers, chills, vomiting, or diarrhea. Denies hematuria, dysuria, increased urinary frequency/urgency, or flank pain. Admits to unprotected sex. Has IUD.  The history is provided by the patient. No language interpreter was used.  Abdominal Pain Associated symptoms: nausea   Associated symptoms: no chest pain, no chills, no cough, no dysuria, no fever, no hematuria, no shortness of breath, no sore throat and no vomiting       Home Medications Prior to Admission medications   Medication Sig Start Date End Date Taking? Authorizing Provider  ARIPiprazole (ABILIFY) 10 MG tablet Take 10 mg by mouth daily. Start abilify 1/2 tab daily then increase to 10mg  after 2 weeks. Patient not taking: Reported on 11/02/2019    [provider]  gabapentin (NEURONTIN) 100 MG capsule Take 1 capsule (100 mg total) by mouth 3 (three) times daily for 14 days. 11/05/19 11/19/19  Haddix, Gillie Manners, MD  HYDROmorphone (DILAUDID) 2 MG tablet Take 1 tablet (2 mg total) by mouth every 4 (four) hours as needed for severe pain. Patient not taking: Reported on 12/07/2019 11/05/19   Haddix, Gillie Manners, MD  methocarbamol (ROBAXIN) 500 MG tablet Take 1 tablet (500 mg total) by mouth every 6 (six) hours as needed for muscle spasms. Patient not taking: Reported on 12/07/2019 11/05/19   Haddix, Gillie Manners, MD  VYVANSE 70 MG capsule  06/02/18   [provider]      Allergies    Phentermine-topiramate, Uncaria tomentosa (cats  claw), Ibuprofen, and Other    Review of Systems   Review of Systems  Constitutional:  Negative for chills and fever.  HENT:  Negative for ear pain and sore throat.   Eyes:  Negative for pain and visual disturbance.  Respiratory:  Negative for cough and shortness of breath.   Cardiovascular:  Negative for chest pain and palpitations.  Gastrointestinal:  Positive for abdominal pain and nausea. Negative for vomiting.  Genitourinary:  Negative for dysuria and hematuria.  Musculoskeletal:  Positive for back pain. Negative for arthralgias.  Skin:  Negative for color change and rash.  Neurological:  Negative for seizures and syncope.  All other systems reviewed and are negative.  Physical Exam Updated Vital Signs BP 129/85 (BP Location: Right Arm)    Pulse 70    Temp 97.9 F (36.6 C) (Oral)    Resp 12    Ht 5\' 2"  (1.575 m)    Wt 56.7 kg    SpO2 100%    BMI 22.86 kg/m  Physical Exam Vitals and nursing note reviewed.  Constitutional:      General: She is not in acute distress.    Appearance: She is well-developed.  HENT:     Head: Normocephalic and atraumatic.  Eyes:     Conjunctiva/sclera: Conjunctivae normal.  Cardiovascular:     Rate and Rhythm: Normal rate and regular rhythm.     Heart sounds: No murmur heard. Pulmonary:     Effort: Pulmonary effort is normal. No respiratory distress.  Breath sounds: Normal breath sounds.  Abdominal:     Palpations: Abdomen is soft.     Tenderness: There is no abdominal tenderness.  Musculoskeletal:        General: No swelling.     Cervical back: Neck supple.  Skin:    General: Skin is warm and dry.     Capillary Refill: Capillary refill takes less than 2 seconds.  Neurological:     Mental Status: She is alert.  Psychiatric:        Mood and Affect: Mood normal.    ED Results / Procedures / Treatments   Labs (all labs ordered are listed, but only abnormal results are displayed) Labs Reviewed  CBC WITH DIFFERENTIAL/PLATELET   COMPREHENSIVE METABOLIC PANEL  LIPASE, BLOOD  URINALYSIS, ROUTINE W REFLEX MICROSCOPIC  HCG, SERUM, QUALITATIVE    EKG None  Radiology No results found.  Procedures Procedures    Medications Ordered in ED Medications - No data to display  ED Course/ Medical Decision Making/ A&P                           Medical Decision Making Amount and/or Complexity of Data Reviewed Labs: ordered.  Risk Prescription drug management.   8:37 AM 25 yo female with pmh of nephrolithiasis, pcos, and gastric bypass presenting for abdominal pain. Patient is Aox3, no acute distress, afebrile, with stable vitals. Abdomen is soft and non tender.   Serum pregnancy negative UA ordered to rule out UTI. Stable renal function  Patient requested to be discharged prior to completion of care stating "I have to leave or I am going to be late for work and get fired". Pt recommended to return if symptoms worsen for completion of care or to follow up with primary care provider.  I told patient I would follow her urine results and call for any infection.    6:40 PM UA demonstrates no UTI.        Final Clinical Impression(s) / ED Diagnoses Final diagnoses:  None    Rx / DC Orders ED Discharge Orders     None         Lianne Cure, DO [id number]

## 2021-07-07 NOTE — ED Triage Notes (Signed)
She tells me she has had generalized abd. Discomfort and "bloating"; radiating at times to back, x 2 weeks. She is in no distress. She tells me that she underwent gastric bypass ~7 years ago.

## 2022-02-26 IMAGING — DX DG ANKLE COMPLETE 3+V*R*
1 series · 3 of 3 positions shown · non-contrast
Comparison: None.

CLINICAL DATA: Twisted ankle yesterday.  Pain.

EXAM:
RIGHT ANKLE - COMPLETE 3+ VIEW

[Series 1: ankle · 0.14mm/px · 3 of 3 slices shown]
[im 1/3]
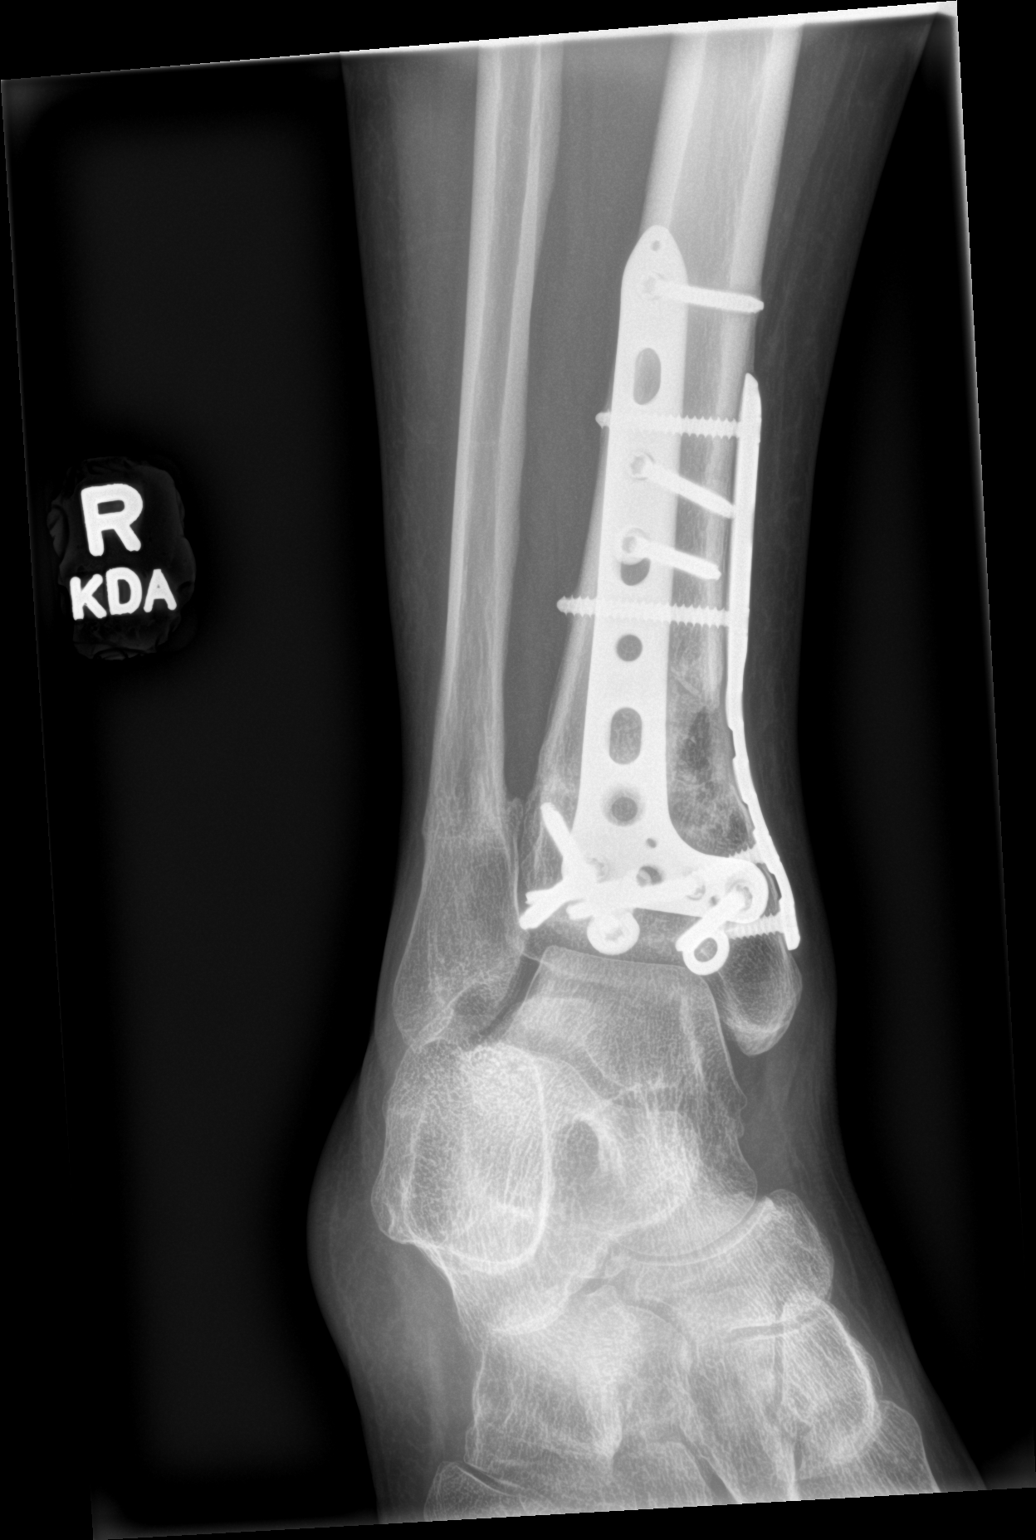
[im 2/3]
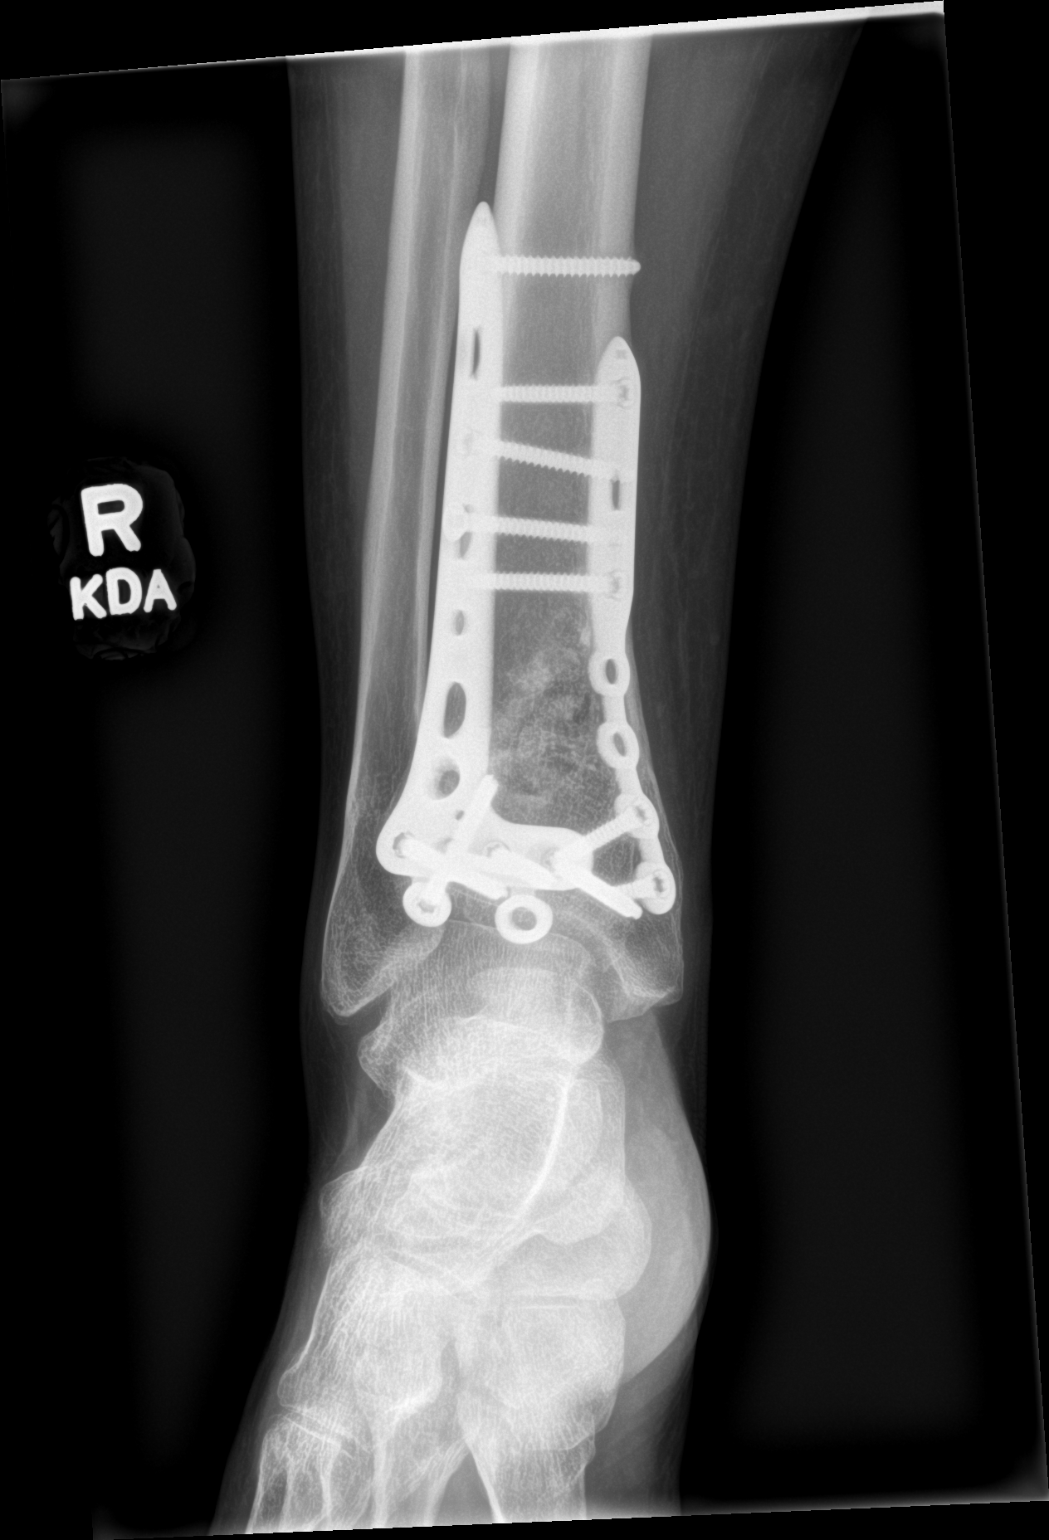
[im 3/3]
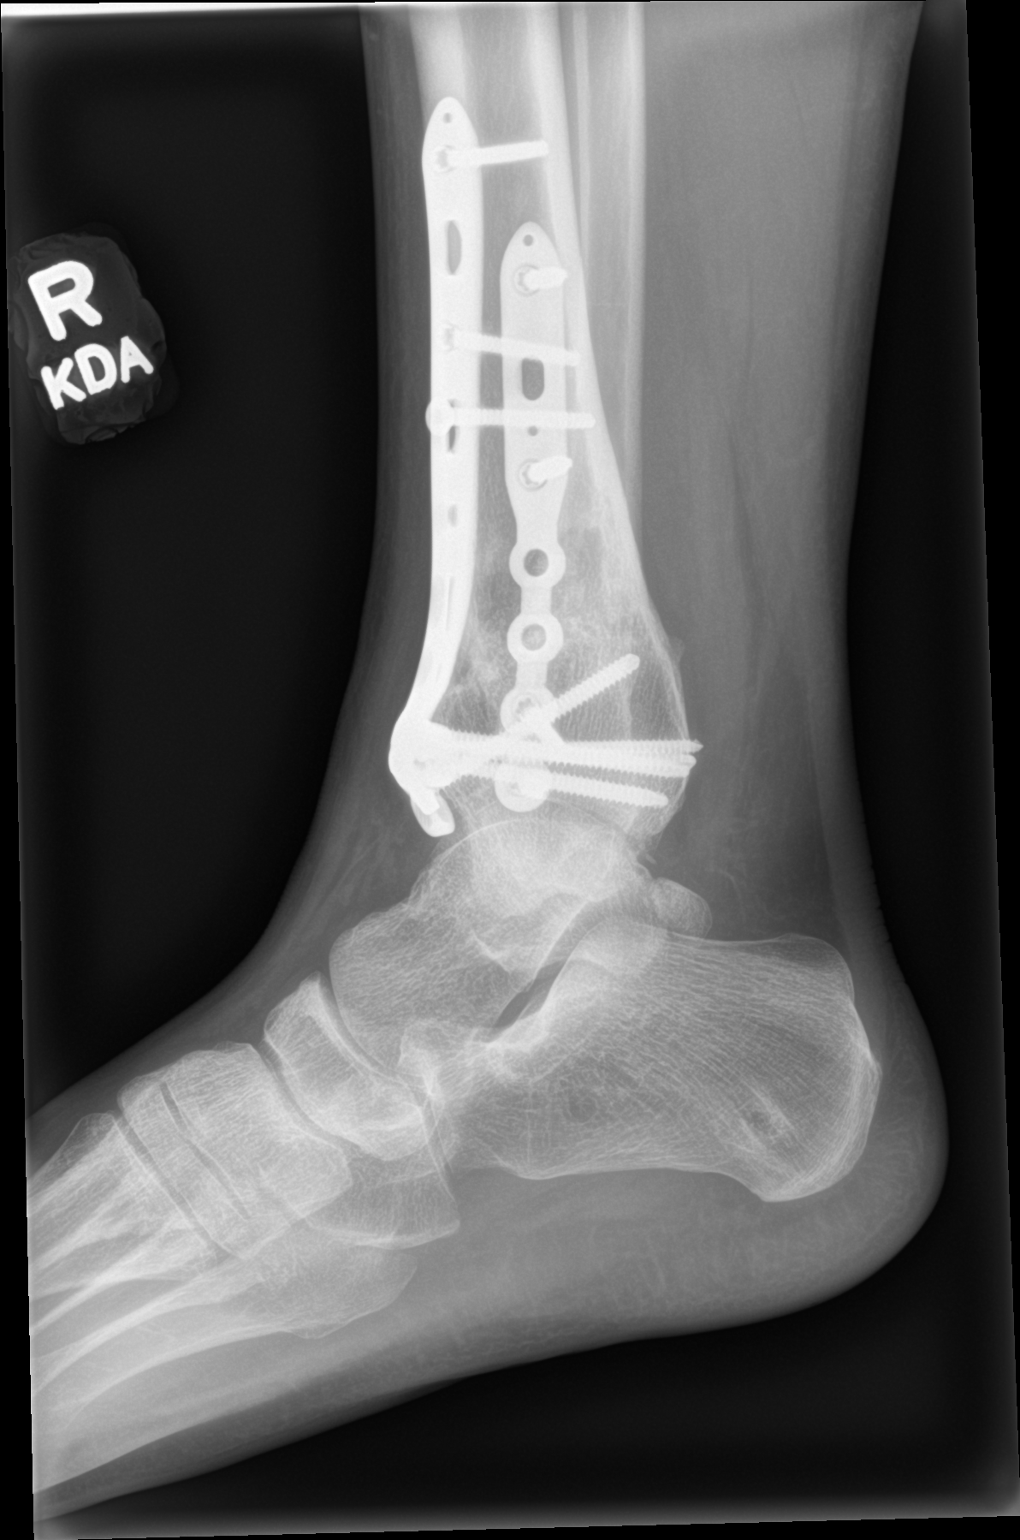

[3 of 3 positions shown; findings below may reference images not displayed]

FINDINGS: The patient has 2 plates and multiple screws affixed to the distal
tibia consistent with previous fracture repair. Hardware is in good
position. A previously identified distal fibular fracture has
healed. No significant soft tissue swelling. No acute fracture
noted. No other acute abnormalities.
IMPRESSION: Previous ORIF of distal tibial fracture. Healing of previous fibular
fracture. No acute fractures noted.

## 2022-09-05 ENCOUNTER — Other Ambulatory Visit (HOSPITAL_COMMUNITY): Payer: Self-pay

## 2022-09-05 ENCOUNTER — Other Ambulatory Visit (HOSPITAL_BASED_OUTPATIENT_CLINIC_OR_DEPARTMENT_OTHER): Payer: Self-pay

## 2022-09-05 MED ORDER — LISDEXAMFETAMINE DIMESYLATE 70 MG PO CAPS
70.0000 mg | ORAL_CAPSULE | Freq: Every morning | ORAL | 0 refills | Status: DC
  Filled 2022-09-05: qty 30, 30d supply, fill #0

## 2023-06-05 ENCOUNTER — Other Ambulatory Visit (HOSPITAL_BASED_OUTPATIENT_CLINIC_OR_DEPARTMENT_OTHER): Payer: Self-pay

## 2023-06-05 MED ORDER — ARIPIPRAZOLE 5 MG PO TABS
5.0000 mg | ORAL_TABLET | Freq: Every day | ORAL | 2 refills | Status: AC
  Filled 2023-06-05: qty 30, 30d supply, fill #0

## 2023-06-05 MED ORDER — LISDEXAMFETAMINE DIMESYLATE 70 MG PO CAPS
70.0000 mg | ORAL_CAPSULE | Freq: Every morning | ORAL | 0 refills | Status: DC
  Filled 2023-06-05: qty 30, 30d supply, fill #0

## 2023-06-05 MED ORDER — BUPROPION HCL ER (SR) 150 MG PO TB12
150.0000 mg | ORAL_TABLET | Freq: Two times a day (BID) | ORAL | 2 refills | Status: AC
  Filled 2023-06-05: qty 60, 30d supply, fill #0

## 2023-06-06 ENCOUNTER — Other Ambulatory Visit (HOSPITAL_BASED_OUTPATIENT_CLINIC_OR_DEPARTMENT_OTHER): Payer: Self-pay

## 2023-07-18 ENCOUNTER — Other Ambulatory Visit (HOSPITAL_BASED_OUTPATIENT_CLINIC_OR_DEPARTMENT_OTHER): Payer: Self-pay

## 2023-07-18 ENCOUNTER — Other Ambulatory Visit: Payer: Self-pay

## 2023-07-18 MED ORDER — LISDEXAMFETAMINE DIMESYLATE 70 MG PO CAPS
70.0000 mg | ORAL_CAPSULE | Freq: Every morning | ORAL | 0 refills | Status: DC
  Filled 2023-07-18: qty 30, 30d supply, fill #0

## 2023-07-21 ENCOUNTER — Other Ambulatory Visit (HOSPITAL_BASED_OUTPATIENT_CLINIC_OR_DEPARTMENT_OTHER): Payer: Self-pay

## 2023-09-01 ENCOUNTER — Other Ambulatory Visit (HOSPITAL_BASED_OUTPATIENT_CLINIC_OR_DEPARTMENT_OTHER): Payer: Self-pay

## 2023-09-01 MED ORDER — LISDEXAMFETAMINE DIMESYLATE 70 MG PO CAPS
70.0000 mg | ORAL_CAPSULE | Freq: Every morning | ORAL | 0 refills | Status: DC
  Filled 2023-09-01: qty 30, 30d supply, fill #0

## 2023-10-13 ENCOUNTER — Other Ambulatory Visit (HOSPITAL_BASED_OUTPATIENT_CLINIC_OR_DEPARTMENT_OTHER): Payer: Self-pay

## 2023-10-13 MED ORDER — ZEPBOUND 2.5 MG/0.5ML ~~LOC~~ SOAJ
2.5000 mg | SUBCUTANEOUS | 0 refills | Status: AC
  Filled 2023-10-13 – 2023-10-18 (×2): qty 2, 28d supply, fill #0

## 2023-10-13 MED ORDER — BUPROPION HCL ER (SR) 150 MG PO TB12
150.0000 mg | ORAL_TABLET | Freq: Two times a day (BID) | ORAL | 11 refills | Status: AC
  Filled 2023-10-13: qty 60, 30d supply, fill #0
  Filled 2023-11-20: qty 60, 30d supply, fill #1

## 2023-10-13 MED ORDER — LISDEXAMFETAMINE DIMESYLATE 70 MG PO CAPS
70.0000 mg | ORAL_CAPSULE | Freq: Every morning | ORAL | 0 refills | Status: DC
  Filled 2023-10-13: qty 30, 30d supply, fill #0

## 2023-10-13 MED ORDER — ARIPIPRAZOLE 5 MG PO TABS
5.0000 mg | ORAL_TABLET | Freq: Every day | ORAL | 11 refills | Status: AC
  Filled 2023-10-13: qty 30, 30d supply, fill #0

## 2023-10-17 ENCOUNTER — Other Ambulatory Visit (HOSPITAL_BASED_OUTPATIENT_CLINIC_OR_DEPARTMENT_OTHER): Payer: Self-pay

## 2023-10-18 ENCOUNTER — Other Ambulatory Visit (HOSPITAL_BASED_OUTPATIENT_CLINIC_OR_DEPARTMENT_OTHER): Payer: Self-pay

## 2023-10-20 ENCOUNTER — Other Ambulatory Visit (HOSPITAL_BASED_OUTPATIENT_CLINIC_OR_DEPARTMENT_OTHER): Payer: Self-pay

## 2023-10-25 ENCOUNTER — Other Ambulatory Visit (HOSPITAL_BASED_OUTPATIENT_CLINIC_OR_DEPARTMENT_OTHER): Payer: Self-pay

## 2023-10-27 ENCOUNTER — Other Ambulatory Visit (HOSPITAL_BASED_OUTPATIENT_CLINIC_OR_DEPARTMENT_OTHER): Payer: Self-pay

## 2023-10-28 ENCOUNTER — Other Ambulatory Visit (HOSPITAL_COMMUNITY): Payer: Self-pay

## 2023-10-28 ENCOUNTER — Other Ambulatory Visit (HOSPITAL_BASED_OUTPATIENT_CLINIC_OR_DEPARTMENT_OTHER): Payer: Self-pay

## 2023-10-28 MED ORDER — METFORMIN HCL ER 500 MG PO TB24
500.0000 mg | ORAL_TABLET | Freq: Every day | ORAL | 5 refills | Status: AC
  Filled 2023-10-28: qty 30, 30d supply, fill #0

## 2023-10-30 ENCOUNTER — Other Ambulatory Visit (HOSPITAL_BASED_OUTPATIENT_CLINIC_OR_DEPARTMENT_OTHER): Payer: Self-pay

## 2023-11-08 ENCOUNTER — Other Ambulatory Visit (HOSPITAL_BASED_OUTPATIENT_CLINIC_OR_DEPARTMENT_OTHER): Payer: Self-pay

## 2023-11-10 ENCOUNTER — Other Ambulatory Visit (HOSPITAL_BASED_OUTPATIENT_CLINIC_OR_DEPARTMENT_OTHER): Payer: Self-pay

## 2023-11-11 ENCOUNTER — Other Ambulatory Visit (HOSPITAL_BASED_OUTPATIENT_CLINIC_OR_DEPARTMENT_OTHER): Payer: Self-pay

## 2023-11-11 MED ORDER — METFORMIN HCL ER 500 MG PO TB24
1000.0000 mg | ORAL_TABLET | Freq: Every day | ORAL | 2 refills | Status: AC
  Filled 2023-11-11: qty 60, 30d supply, fill #0

## 2023-11-12 ENCOUNTER — Other Ambulatory Visit (HOSPITAL_BASED_OUTPATIENT_CLINIC_OR_DEPARTMENT_OTHER): Payer: Self-pay

## 2023-11-12 MED ORDER — LISDEXAMFETAMINE DIMESYLATE 70 MG PO CAPS
70.0000 mg | ORAL_CAPSULE | Freq: Every morning | ORAL | 0 refills | Status: DC
  Filled 2023-11-15 (×2): qty 30, 30d supply, fill #0

## 2023-11-13 ENCOUNTER — Other Ambulatory Visit (HOSPITAL_BASED_OUTPATIENT_CLINIC_OR_DEPARTMENT_OTHER): Payer: Self-pay

## 2023-11-15 ENCOUNTER — Other Ambulatory Visit (HOSPITAL_BASED_OUTPATIENT_CLINIC_OR_DEPARTMENT_OTHER): Payer: Self-pay

## 2023-11-19 ENCOUNTER — Other Ambulatory Visit (HOSPITAL_BASED_OUTPATIENT_CLINIC_OR_DEPARTMENT_OTHER): Payer: Self-pay

## 2023-11-20 ENCOUNTER — Other Ambulatory Visit (HOSPITAL_BASED_OUTPATIENT_CLINIC_OR_DEPARTMENT_OTHER): Payer: Self-pay

## 2023-12-22 ENCOUNTER — Other Ambulatory Visit (HOSPITAL_BASED_OUTPATIENT_CLINIC_OR_DEPARTMENT_OTHER): Payer: Self-pay

## 2023-12-22 MED ORDER — LISDEXAMFETAMINE DIMESYLATE 70 MG PO CAPS
70.0000 mg | ORAL_CAPSULE | Freq: Every morning | ORAL | 0 refills | Status: AC
  Filled 2023-12-22 – 2024-01-02 (×5): qty 30, 30d supply, fill #0

## 2023-12-26 ENCOUNTER — Other Ambulatory Visit (HOSPITAL_BASED_OUTPATIENT_CLINIC_OR_DEPARTMENT_OTHER): Payer: Self-pay

## 2023-12-26 MED ORDER — ARIPIPRAZOLE 5 MG PO TABS
5.0000 mg | ORAL_TABLET | Freq: Every day | ORAL | 11 refills | Status: AC
  Filled 2023-12-26: qty 30, 30d supply, fill #0

## 2024-01-01 ENCOUNTER — Other Ambulatory Visit (HOSPITAL_BASED_OUTPATIENT_CLINIC_OR_DEPARTMENT_OTHER): Payer: Self-pay

## 2024-01-02 ENCOUNTER — Other Ambulatory Visit (HOSPITAL_BASED_OUTPATIENT_CLINIC_OR_DEPARTMENT_OTHER): Payer: Self-pay

## 2024-02-16 ENCOUNTER — Encounter (HOSPITAL_BASED_OUTPATIENT_CLINIC_OR_DEPARTMENT_OTHER): Payer: Self-pay

## 2024-02-16 ENCOUNTER — Other Ambulatory Visit: Payer: Self-pay

## 2024-02-16 ENCOUNTER — Emergency Department (HOSPITAL_BASED_OUTPATIENT_CLINIC_OR_DEPARTMENT_OTHER)
Admission: EM | Admit: 2024-02-16 | Discharge: 2024-02-16 | Disposition: A | Discharge status: Home / Self Care | Payer: Self-pay | Attending: Emergency Medicine | Admitting: Emergency Medicine

## 2024-02-16 ENCOUNTER — Other Ambulatory Visit (HOSPITAL_BASED_OUTPATIENT_CLINIC_OR_DEPARTMENT_OTHER): Payer: Self-pay

## 2024-02-16 DIAGNOSIS — R1110 Vomiting, unspecified: Secondary | ICD-10-CM | POA: Diagnosis present

## 2024-02-16 DIAGNOSIS — R112 Nausea with vomiting, unspecified: Secondary | ICD-10-CM | POA: Diagnosis not present

## 2024-02-16 DIAGNOSIS — R197 Diarrhea, unspecified: Secondary | ICD-10-CM | POA: Insufficient documentation

## 2024-02-16 LAB — CBC
HCT: 40.5 % (ref 36.0–46.0)
Hemoglobin: 13.7 g/dL (ref 12.0–15.0)
MCH: 28.7 pg (ref 26.0–34.0)
MCHC: 33.8 g/dL (ref 30.0–36.0)
MCV: 84.7 fL (ref 80.0–100.0)
Platelets: 311 K/uL (ref 150–400)
RBC: 4.78 MIL/uL (ref 3.87–5.11)
RDW: 13.7 % (ref 11.5–15.5)
WBC: 5.1 K/uL (ref 4.0–10.5)
nRBC: 0 % (ref 0.0–0.2)

## 2024-02-16 LAB — COMPREHENSIVE METABOLIC PANEL WITH GFR
ALT: 13 U/L (ref 0–44)
AST: 24 U/L (ref 15–41)
Albumin: 4.5 g/dL (ref 3.5–5.0)
Alkaline Phosphatase: 59 U/L (ref 38–126)
Anion gap: 17 — ABNORMAL HIGH (ref 5–15)
BUN: 8 mg/dL (ref 6–20)
CO2: 20 mmol/L — ABNORMAL LOW (ref 22–32)
Calcium: 10 mg/dL (ref 8.9–10.3)
Chloride: 101 mmol/L (ref 98–111)
Creatinine, Ser: 0.9 mg/dL (ref 0.44–1.00)
GFR, Estimated: >60 mL/min (ref >60)
Glucose, Bld: 98 mg/dL (ref 70–99)
Potassium: 3.6 mmol/L (ref 3.5–5.1)
Sodium: 138 mmol/L (ref 135–145)
Total Bilirubin: 0.9 mg/dL (ref 0.0–1.2)
Total Protein: 7.4 g/dL (ref 6.5–8.1)

## 2024-02-16 LAB — URINALYSIS, MICROSCOPIC (REFLEX)

## 2024-02-16 LAB — URINALYSIS, ROUTINE W REFLEX MICROSCOPIC
Glucose, UA: NEGATIVE mg/dL
Ketones, ur: >=80 mg/dL — AB
Leukocytes,Ua: NEGATIVE
Nitrite: NEGATIVE
Protein, ur: NEGATIVE mg/dL
Specific Gravity, Urine: 1.025 (ref 1.005–1.030)
pH: 6.5 (ref 5.0–8.0)

## 2024-02-16 LAB — PREGNANCY, URINE: Preg Test, Ur: NEGATIVE

## 2024-02-16 LAB — LIPASE, BLOOD: Lipase: 17 U/L (ref 11–51)

## 2024-02-16 MED ORDER — SODIUM CHLORIDE 0.9 % IV BOLUS
1000.0000 mL | Freq: Once | INTRAVENOUS | Status: AC
  Administered 2024-02-16: 1000 mL via INTRAVENOUS

## 2024-02-16 MED ORDER — LOPERAMIDE HCL 2 MG PO TABS
2.0000 mg | ORAL_TABLET | Freq: Four times a day (QID) | ORAL | 0 refills | Status: AC | PRN
  Filled 2024-02-16: qty 12, 3d supply, fill #0

## 2024-02-16 MED ORDER — DROPERIDOL 2.5 MG/ML IJ SOLN
1.2500 mg | Freq: Once | INTRAMUSCULAR | Status: AC
  Administered 2024-02-16: 1.25 mg via INTRAVENOUS
  Filled 2024-02-16: qty 2

## 2024-02-16 MED ORDER — ONDANSETRON 4 MG PO TBDP
4.0000 mg | ORAL_TABLET | Freq: Three times a day (TID) | ORAL | 0 refills | Status: AC | PRN
  Filled 2024-02-16: qty 20, 7d supply, fill #0

## 2024-02-16 MED ORDER — ONDANSETRON HCL 4 MG/2ML IJ SOLN
4.0000 mg | Freq: Once | INTRAMUSCULAR | Status: AC | PRN
  Administered 2024-02-16: 4 mg via INTRAVENOUS
  Filled 2024-02-16: qty 2

## 2024-02-16 MED ORDER — ONDANSETRON HCL 4 MG/2ML IJ SOLN
4.0000 mg | Freq: Once | INTRAMUSCULAR | Status: AC | PRN
Start: 2024-02-16 — End: 2024-02-16
  Administered 2024-02-16: 4 mg via INTRAVENOUS
  Filled 2024-02-16: qty 2

## 2024-02-16 MED ORDER — LOPERAMIDE HCL 2 MG PO CAPS
2.0000 mg | ORAL_CAPSULE | Freq: Once | ORAL | Status: AC
  Administered 2024-02-16: 2 mg via ORAL
  Filled 2024-02-16: qty 1

## 2024-02-16 NOTE — Discharge Instructions (Signed)
You have been seen in the Emergency Department (ED) today for nausea and vomiting.  Your work up today has not shown a clear cause for your symptoms. °You have been prescribed Zofran; please use as prescribed as needed for your nausea. ° °Follow up with your doctor as soon as possible regarding today?s emergent visit and your symptoms of nausea.  ° °Return to the ED if you develop abdominal, bloody vomiting, bloody diarrhea, if you are unable to tolerate fluids due to vomiting, or if you develop other symptoms that concern you. ° °

## 2024-02-16 NOTE — ED Notes (Signed)
 Said she can't urinate right now

## 2024-02-16 NOTE — ED Notes (Signed)
 Pt unable to eat crackers or keep all the po fluids down. EDP made aware.

## 2024-02-16 NOTE — ED Notes (Signed)
 Gave pt some water to drink. States that she feels better after medication

## 2024-02-16 NOTE — ED Triage Notes (Signed)
 C/o vomiting, abdominal pain & diarrhea x 24. Unable to tolerate PO.

## 2024-02-16 NOTE — ED Provider Notes (Signed)
 Emergency Department Provider Note   I have reviewed the triage vital signs and the nursing notes.   HISTORY  Chief Complaint Vomiting   HPI Colleen Morrison is a 28 y.o. female with past history reviewed below presents to the emergency department for evaluation of vomiting with diffuse abdominal discomfort and diarrhea.  Symptoms been ongoing for the past 24 hours.  She has been unable to keep anything down in terms of solids/liquids.  No fevers or chills.  Some streaks of blood in the emesis but no noticed blood in the stool.  No fevers.  No recent travel or antibiotics.  Describes diffuse abdominal discomfort which is not particularly worse in any location.  Patient states that she drinks alcohol occasionally and drank particularly heavily 2 nights ago which she thinks may have contributed to symptoms.  Denies drug use.   Past Medical History:  Diagnosis Date   Bipolar disorder (HCC)    Depression    History of kidney stones    PCOS (polycystic ovarian syndrome)    Scoliosis    Urinary tract infection     Review of Systems  Constitutional: No fever/chills Cardiovascular: Denies chest pain. Respiratory: Denies shortness of breath. Gastrointestinal: Positive abdominal pain. Positive nausea, vomiting, and diarrhea.  Skin: Negative for rash. Neurological: Negative for headaches.   ____________________________________________   PHYSICAL EXAM:  VITAL SIGNS: ED Triage Vitals  Encounter Vitals Group     BP 02/16/24 0821 (!) 128/100     Pulse Rate 02/16/24 0821 (!) 51     Resp 02/16/24 0821 18     Temp 02/16/24 0819 98.1 F (36.7 C)     Temp Source 02/16/24 0819 Oral     SpO2 02/16/24 0821 100 %     Weight 02/16/24 0817 145 lb (65.8 kg)     Height 02/16/24 0817 5' 2 (1.575 m)   Constitutional: Alert and oriented. Well appearing and in no acute distress. Eyes: Conjunctivae are normal.  Head: Atraumatic. Nose: No congestion/rhinnorhea. Mouth/Throat: Mucous  membranes are moist.   Neck: No stridor.   Cardiovascular: Normal rate, regular rhythm. Good peripheral circulation. Grossly normal heart sounds.   Respiratory: Normal respiratory effort.  No retractions. Lungs CTAB. Gastrointestinal: Soft and nontender. No distention.  Musculoskeletal: No gross deformities of extremities. Neurologic:  Normal speech and language. No gross focal neurologic deficits are appreciated.    ____________________________________________   LABS (all labs ordered are listed, but only abnormal results are displayed)  Labs Reviewed  COMPREHENSIVE METABOLIC PANEL WITH GFR - Abnormal; Notable for the following components:      Result Value   CO2 20 (*)    Anion gap 17 (*)    All other components within normal limits  URINALYSIS, ROUTINE W REFLEX MICROSCOPIC - Abnormal; Notable for the following components:   Hgb urine dipstick TRACE (*)    Bilirubin Urine SMALL (*)    Ketones, ur >=80 (*)    All other components within normal limits  URINALYSIS, MICROSCOPIC (REFLEX) - Abnormal; Notable for the following components:   Bacteria, UA MANY (*)    All other components within normal limits  LIPASE, BLOOD  CBC  PREGNANCY, URINE    ____________________________________________   PROCEDURES  Procedure(s) performed:   Procedures  None  ____________________________________________   INITIAL IMPRESSION / ASSESSMENT AND PLAN / ED COURSE  Pertinent labs & imaging results that were available during my care of the patient were reviewed by me and considered in my medical decision making (see  chart for details).   This patient is Presenting for Evaluation of abdominal pain, which does require a range of treatment options, and is a complaint that involves a high risk of morbidity and mortality.  The Differential Diagnoses includes but is not exclusive to acute cholecystitis, intrathoracic causes for epigastric abdominal pain, gastritis, duodenitis, pancreatitis,  small bowel or large bowel obstruction, abdominal aortic aneurysm, hernia, gastritis, etc.   Critical Interventions-    Medications  ondansetron  (ZOFRAN ) injection 4 mg (4 mg Intravenous Given 02/16/24 0828)  sodium chloride  0.9 % bolus 1,000 mL (0 mLs Intravenous Stopped 02/16/24 1000)  loperamide (IMODIUM) capsule 2 mg (2 mg Oral Given 02/16/24 0917)  ondansetron  (ZOFRAN ) injection 4 mg (4 mg Intravenous Given 02/16/24 1014)  sodium chloride  0.9 % bolus 1,000 mL (0 mLs Intravenous Stopped 02/16/24 1110)  droperidol (INAPSINE) 2.5 MG/ML injection 1.25 mg (1.25 mg Intravenous Given 02/16/24 1137)    Reassessment after intervention: symptoms improved.   Clinical Laboratory Tests Ordered, included UA shows signs of dehydration with ketones but no findings to strongly suggest UTI.  Pregnancy negative.  CBC without leukocytosis.  CMP shows normal LFTs, bilirubin, lipase.  Radiologic Tests: Considered CT abdomen pelvis but vital signs are largely unremarkable and exam is reassuring.  No point tenderness or peritonitis. Defer imaging for now.    Cardiac Monitor Tracing which shows NSR.    Social Determinants of Health Risk occasional heavy EtOH.   Medical Decision Making: Summary:  The patient presents the emergency department for evaluation of generalized abdominal discomfort with vomiting and diarrhea.  Abdomen is diffusely soft and nontender.  No hypotension or severe tachycardia.  Plan for screening blood work, IV fluids, Zofran , Imodium and reassess.  Defer imaging for now.  Reevaluation with update and discussion with patient. She is feeling better after meds and IVF in the ED. Tolerating PO prior to discharge. Meds sent to pharmacy to assist with symptoms. Discussed PO hydration at home with PCP follow up plan.   Considered admission but labs are reassuring and patient is tolerating PO. Stable for discharge.   Patient's presentation is most consistent with acute presentation with potential  threat to life or bodily function.   Disposition: discharge  ____________________________________________  FINAL CLINICAL IMPRESSION(S) / ED DIAGNOSES  Final diagnoses:  Nausea vomiting and diarrhea     NEW OUTPATIENT MEDICATIONS STARTED DURING THIS VISIT:  Discharge Medication List as of 02/16/2024 12:54 PM     START taking these medications   Details  loperamide (IMODIUM A-D) 2 MG tablet Take 1 capsule (2 mg total) by mouth 4 (four) times daily as needed for diarrhea or loose stools., Starting Mon 02/16/2024, Normal    ondansetron  (ZOFRAN -ODT) 4 MG disintegrating tablet Take 1 tablet (4 mg total) by mouth every 8 (eight) hours as needed., Starting Mon 02/16/2024, Normal        Note:  This document was prepared using Dragon voice recognition software and may include unintentional dictation errors.  Fonda Law, MD, [facility] Emergency Medicine    Tallin Hart, Fonda MATSU, MD 02/16/24 [id number]

## 2024-03-25 ENCOUNTER — Other Ambulatory Visit (HOSPITAL_BASED_OUTPATIENT_CLINIC_OR_DEPARTMENT_OTHER): Payer: Self-pay
